# Patient Record
Sex: Female | Born: 1965
Health system: Southern US, Community
[De-identification: ages and names within clinical notes are randomized; demographics above are authoritative.]

## PROBLEM LIST (undated history)

## (undated) ENCOUNTER — Emergency Department (HOSPITAL_BASED_OUTPATIENT_CLINIC_OR_DEPARTMENT_OTHER): Admission: EM | Payer: No Typology Code available for payment source | Source: Home / Self Care

## (undated) DIAGNOSIS — G43909 Migraine, unspecified, not intractable, without status migrainosus: Secondary | ICD-10-CM

## (undated) DIAGNOSIS — R0981 Nasal congestion: Secondary | ICD-10-CM

## (undated) DIAGNOSIS — I10 Essential (primary) hypertension: Secondary | ICD-10-CM

## (undated) DIAGNOSIS — F418 Other specified anxiety disorders: Secondary | ICD-10-CM

## (undated) DIAGNOSIS — K219 Gastro-esophageal reflux disease without esophagitis: Secondary | ICD-10-CM

## (undated) HISTORY — PX: ABDOMINAL HYSTERECTOMY: SHX81

## (undated) HISTORY — DX: Gastro-esophageal reflux disease without esophagitis: K21.9

---

## 2011-11-08 ENCOUNTER — Emergency Department (HOSPITAL_COMMUNITY)
Admission: EM | Admit: 2011-11-08 | Discharge: 2011-11-08 | Disposition: A | Discharge status: Home / Self Care | Payer: Self-pay | Attending: Emergency Medicine | Admitting: Emergency Medicine

## 2011-11-08 ENCOUNTER — Encounter (HOSPITAL_COMMUNITY): Payer: Self-pay | Admitting: Physical Medicine and Rehabilitation

## 2011-11-08 ENCOUNTER — Emergency Department (HOSPITAL_COMMUNITY): Payer: Self-pay

## 2011-11-08 DIAGNOSIS — IMO0002 Reserved for concepts with insufficient information to code with codable children: Secondary | ICD-10-CM | POA: Insufficient documentation

## 2011-11-08 DIAGNOSIS — Y9229 Other specified public building as the place of occurrence of the external cause: Secondary | ICD-10-CM | POA: Insufficient documentation

## 2011-11-08 DIAGNOSIS — S43409A Unspecified sprain of unspecified shoulder joint, initial encounter: Secondary | ICD-10-CM

## 2011-11-08 DIAGNOSIS — F17200 Nicotine dependence, unspecified, uncomplicated: Secondary | ICD-10-CM | POA: Insufficient documentation

## 2011-11-08 DIAGNOSIS — S63509A Unspecified sprain of unspecified wrist, initial encounter: Secondary | ICD-10-CM | POA: Insufficient documentation
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
## 2011-11-08 NOTE — Progress Notes (Signed)
Orthopedic Tech Progress Note Patient Details:  Elaine Baker 1966-04-12 [id number]  Ortho Devices Type of Ortho Device: Velcro wrist splint;Arm foam sling Ortho Device/Splint Location: left wrist\arm Ortho Device/Splint Interventions: Application   Perla Echavarria 11/08/2011, 3:04 PM
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
## 2011-11-08 NOTE — Discharge Instructions (Signed)
Shoulder Sprain       A shoulder sprain is the result of damage to the tough, fiber-like tissues (ligaments) that help hold your shoulder in place. The ligaments may be stretched or torn. Besides the main shoulder joint (the ball and socket), there are several smaller joints that connect the bones in this area. A sprain usually involves one of those joints. Most often it is the acromioclavicular (or AC) joint. That is the joint that connects the collarbone (clavicle) and the shoulder blade (scapula) at the top point of the shoulder blade (acromion).   A shoulder sprain is a mild form of what is called a shoulder separation. Recovering from a shoulder sprain may take some time. For some, pain lingers for several months. Most people recover without long term problems.   CAUSES   A shoulder sprain is usually caused by some kind of trauma. This might be:   Falling on an outstretched arm.   Being hit hard on the shoulder.   Twisting the arm.   Shoulder sprains are more likely to occur in people who:   Play sports.   Have balance or coordination problems.  SYMPTOMS   Pain when you move your shoulder.   Limited ability to move the shoulder.   Swelling and tenderness on top of the shoulder.   Redness or warmth in the shoulder.   Bruising.   A change in the shape of the shoulder.  DIAGNOSIS   Your healthcare provider may:   Ask about your symptoms.   Ask about recent activity that might have caused those symptoms.   Examine your shoulder. You may be asked to do simple exercises to test movement. The other shoulder will be examined for comparison.   Order some tests that provide a look inside the body. They can show the extent of the injury. The tests could include:   X-rays.   CT (computed tomography) scan.   MRI (magnetic resonance imaging) scan.  RISKS AND COMPLICATIONS   Loss of full shoulder motion.   Ongoing shoulder pain.  TREATMENT   How long it takes to recover from a shoulder sprain depends on how severe it was.  Treatment options may include:   Rest. You should not use the arm or shoulder until it heals.   Ice. For 2 or 3 days after the injury, put an ice pack on the shoulder up to 4 times a day. It should stay on for 15 to 20 minutes each time. Wrap the ice in a towel so it does not touch your skin.   Over-the-counter medicine to relieve pain.   A sling or brace. This will keep the arm still while the shoulder is healing.   Physical therapy or rehabilitation exercises. These will help you regain strength and motion. Ask your healthcare provider when it is OK to begin these exercises.   Surgery. The need for surgery is rare with a sprained shoulder, but some people may need surgery to keep the joint in place and reduce pain.  HOME CARE INSTRUCTIONS   Ask your healthcare provider about what you should and should not do while your shoulder heals.   Make sure you know how to apply ice to the correct area of your shoulder.   Talk with your healthcare provider about which medications should be used for pain and swelling.   If rehabilitation therapy will be needed, ask your healthcare provider to refer you to a therapist. If it is not recommended, then ask   Your pain, swelling, or redness at the joint increases. SEEK IMMEDIATE MEDICAL CARE IF:   You have a fever.   You cannot move your arm or shoulder.  Document Released: 09/29/2008 Document Revised: 05/02/2011 Document Reviewed: 09/29/2008 [facility] Patient Information 2012 [place], [place].Wrist Pain A wrist sprain happens when the bands of tissue that hold the wrist joints together (ligament) stretch too much or tear. A wrist strain happens when muscles or bands of tissue that connect muscles to bones (tendons) are stretched or pulled. HOME CARE  Put ice on the injured area.   Put ice in a plastic bag.   Place a towel  between your skin and the bag.   Leave the ice on for 15 to 20 minutes, 3 to 4 times a day, for the first 2 days.   Raise (elevate) the injured wrist to lessen puffiness (swelling).   Rest the injured wrist for at least 48 hours or as told by your doctor.   Wear a splint, cast, or an elastic wrap as told by your doctor.   Only take medicine as told by your doctor.   Follow up with your doctor as told. This is important.  GET HELP RIGHT AWAY IF:   The fingers are puffy, very red, white, or cold and blue.   The fingers lose feeling (numb) or tingle.   The pain gets worse.   It is hard to move the fingers.  MAKE SURE YOU:   Understand these instructions.   Will watch your condition.   Will get help right away if you are not doing well or get worse.  Document Released: 10/30/2007 Document Revised: 05/02/2011 Document Reviewed: 07/04/2010 [facility] Patient Information 2012 [place], [place].
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
## 2012-03-01 ENCOUNTER — Emergency Department (HOSPITAL_BASED_OUTPATIENT_CLINIC_OR_DEPARTMENT_OTHER)
Admission: EM | Admit: 2012-03-01 | Discharge: 2012-03-01 | Disposition: A | Discharge status: Home / Self Care | Payer: Self-pay | Attending: Emergency Medicine | Admitting: Emergency Medicine

## 2012-03-01 ENCOUNTER — Encounter (HOSPITAL_BASED_OUTPATIENT_CLINIC_OR_DEPARTMENT_OTHER): Payer: Self-pay | Admitting: *Deleted

## 2012-03-01 DIAGNOSIS — W010XXA Fall on same level from slipping, tripping and stumbling without subsequent striking against object, initial encounter: Secondary | ICD-10-CM | POA: Insufficient documentation

## 2012-03-01 DIAGNOSIS — S40029A Contusion of unspecified upper arm, initial encounter: Secondary | ICD-10-CM | POA: Insufficient documentation

## 2012-03-01 DIAGNOSIS — S40022A Contusion of left upper arm, initial encounter: Secondary | ICD-10-CM

## 2012-03-01 DIAGNOSIS — F17200 Nicotine dependence, unspecified, uncomplicated: Secondary | ICD-10-CM | POA: Insufficient documentation

## 2012-03-01 MED ORDER — IBUPROFEN 400 MG PO TABS
600.0000 mg | ORAL_TABLET | Freq: Once | ORAL | Status: AC
  Administered 2012-03-01: 600 mg via ORAL
  Filled 2012-03-01 (×2): qty 1

## 2012-03-01 MED ORDER — IBUPROFEN 600 MG PO TABS: 600.0000 mg | ORAL_TABLET | Freq: Four times a day (QID) | ORAL | Status: DC | PRN

## 2012-03-01 NOTE — ED Provider Notes (Signed)
History     CSN: [id number]  Arrival date & time 03/01/12  1152   First MD Initiated Contact with Patient 03/01/12 1243      Chief Complaint  Patient presents with  . Arm Injury    (Consider location/radiation/quality/duration/timing/severity/associated sxs/prior treatment) HPI Pt tripped and fell onto L arm last night. No definite injury, deformity, swelling. No numbness or weakness. Pt c/o of diffuse pain to the arm without focality.  History reviewed. No pertinent past medical history.  Past Surgical History  Procedure Date  . Abdominal hysterectomy     History reviewed. No pertinent family history.  History  Substance Use Topics  . Smoking status: Current Every Day Smoker  . Smokeless tobacco: Not on file  . Alcohol Use: No    OB History    Grav Para Term Preterm Abortions TAB SAB Ect Mult Living                  Review of Systems  Constitutional: Negative for fever and chills.  HENT: Negative for neck pain.   Respiratory: Negative for shortness of breath.   Gastrointestinal: Negative for nausea and vomiting.  Musculoskeletal: Positive for myalgias and arthralgias. Negative for back pain and joint swelling.  Skin: Negative for rash and wound.  Neurological: Negative for dizziness, weakness, light-headedness, numbness and headaches.    Allergies  Review of patient's allergies indicates no known allergies.  Home Medications   Current Outpatient Rx  Name Route Sig Dispense Refill  . ALKA-SELTZER PO Oral Take 2 tablets by mouth 3 (three) times daily as needed. For body aches.    . IBUPROFEN 600 MG PO TABS Oral Take 1 tablet (600 mg total) by mouth every 6 (six) hours as needed for pain. 30 tablet 0    BP 124/74  Pulse 68  Temp 97.8 F (36.6 C)  Resp 20  SpO2 98%  Physical Exam  Nursing note and vitals reviewed. Constitutional: She is oriented to person, place, and time. She appears well-developed and well-nourished. No distress.  HENT:  Head:  Normocephalic and atraumatic.  Mouth/Throat: Oropharynx is clear and moist.  Eyes: EOM are normal. Pupils are equal, round, and reactive to light.  Neck: Normal range of motion. Neck supple.  Cardiovascular: Normal rate and regular rhythm.   Pulmonary/Chest: Effort normal and breath sounds normal. No respiratory distress. She has no wheezes. She has no rales.  Abdominal: Soft. Bowel sounds are normal.  Musculoskeletal: Normal range of motion. She exhibits tenderness (general TTP of musculature of L arm. FROM at all joints. no swelling bruising or deformity. no snuff box tenderness. 2+ radial pulse. ). She exhibits no edema.  Neurological: She is alert and oriented to person, place, and time.       5/5 motor, sensation is intact  Skin: Skin is warm and dry. No rash noted. No erythema.  Psychiatric: She has a normal mood and affect. Her behavior is normal.    ED Course  Procedures (including critical care time)  Labs Reviewed - No data to display No results found.   1. Contusion of left arm       MDM          Loren Racer, MD 03/01/12 1303

## 2012-03-01 NOTE — ED Notes (Signed)
Patient was leaning on a post that broke and she fell on L arm, hurts to move arm, took some tylenol and naproxen but no relief. States it hurts from my hand up to my shoulder

## 2012-03-06 ENCOUNTER — Emergency Department (HOSPITAL_BASED_OUTPATIENT_CLINIC_OR_DEPARTMENT_OTHER): Payer: Self-pay

## 2012-03-06 ENCOUNTER — Emergency Department (HOSPITAL_BASED_OUTPATIENT_CLINIC_OR_DEPARTMENT_OTHER)
Admission: EM | Admit: 2012-03-06 | Discharge: 2012-03-06 | Disposition: A | Discharge status: Home / Self Care | Payer: Self-pay | Attending: Emergency Medicine | Admitting: Emergency Medicine

## 2012-03-06 ENCOUNTER — Encounter (HOSPITAL_BASED_OUTPATIENT_CLINIC_OR_DEPARTMENT_OTHER): Payer: Self-pay | Admitting: *Deleted

## 2012-03-06 DIAGNOSIS — W19XXXA Unspecified fall, initial encounter: Secondary | ICD-10-CM | POA: Insufficient documentation

## 2012-03-06 DIAGNOSIS — F17200 Nicotine dependence, unspecified, uncomplicated: Secondary | ICD-10-CM | POA: Insufficient documentation

## 2012-03-06 DIAGNOSIS — S53409A Unspecified sprain of unspecified elbow, initial encounter: Secondary | ICD-10-CM

## 2012-03-06 DIAGNOSIS — IMO0002 Reserved for concepts with insufficient information to code with codable children: Secondary | ICD-10-CM | POA: Insufficient documentation

## 2012-03-06 MED ORDER — HYDROCODONE-ACETAMINOPHEN 5-325 MG PO TABS
2.0000 | ORAL_TABLET | Freq: Once | ORAL | Status: AC
  Administered 2012-03-06: 2 via ORAL
  Filled 2012-03-06: qty 2

## 2012-03-06 MED ORDER — OXYCODONE-ACETAMINOPHEN 5-325 MG PO TABS: 2.0000 | ORAL_TABLET | ORAL | Status: DC | PRN

## 2012-03-06 MED ORDER — IBUPROFEN 800 MG PO TABS: 800.0000 mg | ORAL_TABLET | Freq: Three times a day (TID) | ORAL | Status: DC

## 2012-03-06 NOTE — ED Provider Notes (Signed)
History     CSN: [id number]  Arrival date & time 03/06/12  1701   First MD Initiated Contact with Patient 03/06/12 1830      Chief Complaint  Patient presents with  . Arm Pain    (Consider location/radiation/quality/duration/timing/severity/associated sxs/prior treatment) HPI Comments: Patient presents with left elbow pain that started one week ago after falling and landing on her left elbow. The pain started immediately and is described as throbbing and severe. The pain radiates up to her left shoulder. Movement of the elbow makes the pain worse. No alleviating factors. Patient seen one week ago, after she fell and was prescribed 600mg  ibuprofen and did not have xray done. Patient reports persistent pain despite ibuprofen and is concerned something is broken. Patient reports associated left arm swelling and finger tingling. Patient denies head injury, new injury in the past week, numbness of her left arm, coolness/weakness of her left arm.    History reviewed. No pertinent past medical history.  Past Surgical History  Procedure Date  . Abdominal hysterectomy     No family history on file.  History  Substance Use Topics  . Smoking status: Current Every Day Smoker  . Smokeless tobacco: Not on file  . Alcohol Use: No    OB History    Grav Para Term Preterm Abortions TAB SAB Ect Mult Living                  Review of Systems  Musculoskeletal: Positive for arthralgias.  All other systems reviewed and are negative.    Allergies  Review of patient's allergies indicates no known allergies.  Home Medications   Current Outpatient Rx  Name Route Sig Dispense Refill  . ALKA-SELTZER PO Oral Take 2 tablets by mouth 3 (three) times daily as needed. For body aches.    . IBUPROFEN 600 MG PO TABS Oral Take 1 tablet (600 mg total) by mouth every 6 (six) hours as needed for pain. 30 tablet 0    BP 177/98  Pulse 67  Temp 98.1 F (36.7 C) (Oral)  Resp 20  SpO2  98%  Physical Exam  Nursing note and vitals reviewed. Constitutional: She appears well-developed and well-nourished. No distress.  HENT:  Head: Normocephalic and atraumatic.  Eyes: Conjunctivae normal are normal. No scleral icterus.  Neck: Normal range of motion. Neck supple.  Cardiovascular: Normal rate and regular rhythm.  Exam reveals no gallop and no friction rub.   No murmur heard.      Sufficient capillary refill of distal upper extremities. Pulses intact.   Pulmonary/Chest: Effort normal and breath sounds normal. She has no wheezes. She has no rales. She exhibits no tenderness.  Abdominal: Soft. She exhibits no distension.  Musculoskeletal: Normal range of motion.  Neurological: She is alert.       Upper extremity strength and sensation equal and intact bilaterally. Speech is goal-oriented. Moves limbs without ataxia.   Skin: Skin is warm and dry.  Psychiatric: She has a normal mood and affect. Her behavior is normal.    ED Course  Procedures (including critical care time)  Labs Reviewed - No data to display Dg Elbow Complete Left  03/06/2012  *RADIOLOGY REPORT*  Clinical Data: Fall 1 week ago.  Left elbow injury and pain.  LEFT ELBOW - COMPLETE 3+ VIEW  Comparison:  None.  Findings:  There is no evidence of fracture, dislocation, or joint effusion. Mild degenerative spurring is seen involving the medial and lateral humeral condyles.  No other  significant bone abnormality identified.  Soft tissues are unremarkable.  IMPRESSION: 1.  No acute findings. 2.  Mild degenerative spurring.   Original Report Authenticated By: Danae Orleans, M.D.      1. Elbow sprain       MDM  7:38 PM Patient's elbow xray negative for fracture. Residual swelling and pain. I will discharge her with pain medication and instructions for RICE therapy. No further evaluation needed at this time.        Emilia Beck, PA-C 03/08/12 0028

## 2012-03-06 NOTE — ED Notes (Signed)
Pain in her right arm after a fall a week ago. Was seen a week ago with same.

## 2012-03-08 NOTE — ED Provider Notes (Signed)
Medical screening examination/treatment/procedure(s) were performed by non-physician practitioner and as supervising physician I was immediately available for consultation/collaboration.   Rolan Bucco, MD 03/08/12 1324

## 2012-04-30 ENCOUNTER — Emergency Department (HOSPITAL_BASED_OUTPATIENT_CLINIC_OR_DEPARTMENT_OTHER)
Admission: EM | Admit: 2012-04-30 | Discharge: 2012-04-30 | Disposition: A | Discharge status: Home / Self Care | Payer: Self-pay | Attending: Emergency Medicine | Admitting: Emergency Medicine

## 2012-04-30 ENCOUNTER — Emergency Department (HOSPITAL_BASED_OUTPATIENT_CLINIC_OR_DEPARTMENT_OTHER): Payer: Self-pay

## 2012-04-30 ENCOUNTER — Encounter (HOSPITAL_BASED_OUTPATIENT_CLINIC_OR_DEPARTMENT_OTHER): Payer: Self-pay | Admitting: *Deleted

## 2012-04-30 DIAGNOSIS — F17200 Nicotine dependence, unspecified, uncomplicated: Secondary | ICD-10-CM | POA: Insufficient documentation

## 2012-04-30 DIAGNOSIS — K219 Gastro-esophageal reflux disease without esophagitis: Secondary | ICD-10-CM | POA: Insufficient documentation

## 2012-04-30 DIAGNOSIS — S8990XA Unspecified injury of unspecified lower leg, initial encounter: Secondary | ICD-10-CM | POA: Insufficient documentation

## 2012-04-30 DIAGNOSIS — Z79899 Other long term (current) drug therapy: Secondary | ICD-10-CM | POA: Insufficient documentation

## 2012-04-30 DIAGNOSIS — M25579 Pain in unspecified ankle and joints of unspecified foot: Secondary | ICD-10-CM

## 2012-04-30 DIAGNOSIS — B351 Tinea unguium: Secondary | ICD-10-CM | POA: Insufficient documentation

## 2012-04-30 DIAGNOSIS — M25569 Pain in unspecified knee: Secondary | ICD-10-CM

## 2012-04-30 DIAGNOSIS — W2203XA Walked into furniture, initial encounter: Secondary | ICD-10-CM | POA: Insufficient documentation

## 2012-04-30 DIAGNOSIS — Y939 Activity, unspecified: Secondary | ICD-10-CM | POA: Insufficient documentation

## 2012-04-30 DIAGNOSIS — S99929A Unspecified injury of unspecified foot, initial encounter: Secondary | ICD-10-CM | POA: Insufficient documentation

## 2012-04-30 DIAGNOSIS — Y92009 Unspecified place in unspecified non-institutional (private) residence as the place of occurrence of the external cause: Secondary | ICD-10-CM | POA: Insufficient documentation

## 2012-04-30 MED ORDER — OMEPRAZOLE 20 MG PO CPDR: 20.0000 mg | DELAYED_RELEASE_CAPSULE | Freq: Every evening | ORAL | Status: DC | PRN

## 2012-04-30 MED ORDER — IBUPROFEN 400 MG PO TABS: 400.0000 mg | ORAL_TABLET | Freq: Four times a day (QID) | ORAL | Status: DC | PRN

## 2012-04-30 NOTE — ED Notes (Signed)
Patient states she hit her left knee on the bed yesterday and now has pain deep into her left knee.  States she also has toenail fungus and wants to be treated for that as well.

## 2012-04-30 NOTE — ED Notes (Addendum)
D/c home with family- directed to pharmacy to pick up RX x 2- ambulatory without need for assist- ice pack given for home use

## 2012-05-03 NOTE — ED Provider Notes (Signed)
History     CSN: [id number]  Arrival date & time 04/30/12  1023   First MD Initiated Contact with Patient 04/30/12 1136      Chief Complaint  Patient presents with  . Knee Pain  . Ankle Pain    left    (Consider location/radiation/quality/duration/timing/severity/associated sxs/prior treatment) HPIRegina Baker is a 46 y.o. female presenting with her family to the ER because she had her left knee on the bed yesterday and has pain in her left knee. She's been walking on her knee since then. Patient's pain is rated as moderate worse with walking, does not radiate, is not associated with any fevers, chills, nausea vomiting or diarrhea, no chest pain or shortness of breath. Is been no bleeding. Patient also complains about toenail fungus to bilateral feet and requesting treatment.   Past Medical History  Diagnosis Date  . Reflux     Past Surgical History  Procedure Date  . Abdominal hysterectomy     No family history on file.  History  Substance Use Topics  . Smoking status: Current Every Day Smoker -- 0.5 packs/day    Types: Cigarettes  . Smokeless tobacco: Not on file  . Alcohol Use: No    OB History    Grav Para Term Preterm Abortions TAB SAB Ect Mult Living                  Review of Systems At least 10pt or greater review of systems completed and are negative except where specified in the HPI.  Allergies  Vicodin  Home Medications   Current Outpatient Rx  Name  Route  Sig  Dispense  Refill  . ALKA-SELTZER PO   Oral   Take 2 tablets by mouth 3 (three) times daily as needed. For body aches.         . IBUPROFEN 400 MG PO TABS   Oral   Take 1 tablet (400 mg total) by mouth every 6 (six) hours as needed for pain.   30 tablet   0   . IBUPROFEN 600 MG PO TABS   Oral   Take 1 tablet (600 mg total) by mouth every 6 (six) hours as needed for pain.   30 tablet   0   . IBUPROFEN 800 MG PO TABS   Oral   Take 1 tablet (800 mg total) by mouth 3  (three) times daily.   21 tablet   0   . OMEPRAZOLE 20 MG PO CPDR   Oral   Take 1 capsule (20 mg total) by mouth at bedtime and may repeat dose one time if needed.   30 capsule   1   . OXYCODONE-ACETAMINOPHEN 5-325 MG PO TABS   Oral   Take 2 tablets by mouth every 4 (four) hours as needed for pain.   6 tablet   0     BP 124/80  Pulse 99  Temp 98.2 F (36.8 C) (Oral)  Resp 16  Ht 5\' 4"  (1.626 m)  Wt 154 lb (69.854 kg)  BMI 26.43 kg/m2  SpO2 100%  Physical Exam  Nursing notes reviewed.  Electronic medical record reviewed. VITAL SIGNS:   Filed Vitals:   04/30/12 1049  BP: 124/80  Pulse: 99  Temp: 98.2 F (36.8 C)  TempSrc: Oral  Resp: 16  Height: 5\' 4"  (1.626 m)  Weight: 154 lb (69.854 kg)  SpO2: 100%   CONSTITUTIONAL: Awake, oriented, appears non-toxic HENT: Atraumatic, normocephalic, oral mucosa pink and moist, airway  patent. Nares patent without drainage. External ears normal. EYES: Conjunctiva clear, EOMI, PERRLA NECK: Trachea midline, non-tender, supple CARDIOVASCULAR: Normal heart rate, Normal rhythm, No murmurs, rubs, gallops PULMONARY/CHEST: Clear to auscultation, no rhonchi, wheezes, or rales. Symmetrical breath sounds. Non-tender. ABDOMINAL: Non-distended, soft, non-tender - no rebound or guarding.  BS normal. NEUROLOGIC: Non-focal, moving all four extremities, no gross sensory or motor deficits. EXTREMITIES: No clubbing, cyanosis, or edema. No real tenderness to palpation in the left knee. Bilateral lower extremity onychomycosis SKIN: Warm, Dry, No erythema, No rash  ED Course  Procedures (including critical care time)  Labs Reviewed - No data to display No results found.   1. Knee pain   2. Ankle pain   3. GERD (gastroesophageal reflux disease)       MDM  Elaine Baker is a 45 y.o. female presenting with some knee pain-I do not think anything is broken, patient meets criteria for no x-rays by [place] ankle and knee rules so x-rays not  indicated at this time. Conservative therapy including rest ice compression and elevation including NSAIDs.  I had a discussion with the patient concerning her bilateral lower extremity onychomycosis and that the treatment is largely cosmetic-the treatment for that does run a risk of liver damage and had that mentioned, the patient declines treatment.  I explained the diagnosis and have given explicit precautions to return to the ER including any other new or worsening symptoms. The patient understands and accepts the medical plan as it's been dictated and I have answered their questions. Discharge instructions concerning home care and prescriptions have been given.  The patient is STABLE and is discharged to home in good condition.          Jones Skene, MD 05/03/12 2144

## 2012-07-07 NOTE — ED Notes (Signed)
Pt called and stated that she had received the wrong medical records.  States she was seen at the same time as her daughter with the same name.  States she was seen for her knee and ankle pain from an injury.  Reviewed discharge paper work with pt which shows she was seen for knee and ankle pain, given prescriptions for ibuprofen and omeprasole which she states she only received ibuprofen.  Pt was yelling into the phone and sounded very angry about her paperwork and stated that she would not pay the bill and would not be coming back. Reassured pt that she was seen for what she was telling me she was here for and to contact patient accounting for concerns about her statement.

## 2012-08-13 ENCOUNTER — Emergency Department (HOSPITAL_COMMUNITY)
Admission: EM | Admit: 2012-08-13 | Discharge: 2012-08-13 | Disposition: A | Discharge status: Home / Self Care | Payer: No Typology Code available for payment source | Attending: Emergency Medicine | Admitting: Emergency Medicine

## 2012-08-13 ENCOUNTER — Encounter (HOSPITAL_COMMUNITY): Payer: Self-pay | Admitting: General Practice

## 2012-08-13 DIAGNOSIS — Y9389 Activity, other specified: Secondary | ICD-10-CM | POA: Insufficient documentation

## 2012-08-13 DIAGNOSIS — F17200 Nicotine dependence, unspecified, uncomplicated: Secondary | ICD-10-CM | POA: Insufficient documentation

## 2012-08-13 DIAGNOSIS — S134XXA Sprain of ligaments of cervical spine, initial encounter: Secondary | ICD-10-CM

## 2012-08-13 DIAGNOSIS — R209 Unspecified disturbances of skin sensation: Secondary | ICD-10-CM | POA: Insufficient documentation

## 2012-08-13 DIAGNOSIS — Z8719 Personal history of other diseases of the digestive system: Secondary | ICD-10-CM | POA: Insufficient documentation

## 2012-08-13 DIAGNOSIS — G44309 Post-traumatic headache, unspecified, not intractable: Secondary | ICD-10-CM | POA: Insufficient documentation

## 2012-08-13 DIAGNOSIS — Y92410 Unspecified street and highway as the place of occurrence of the external cause: Secondary | ICD-10-CM | POA: Insufficient documentation

## 2012-08-13 DIAGNOSIS — S139XXA Sprain of joints and ligaments of unspecified parts of neck, initial encounter: Secondary | ICD-10-CM | POA: Insufficient documentation

## 2012-08-13 MED ORDER — ACETAMINOPHEN-CODEINE #3 300-30 MG PO TABS: 1.0000 | ORAL_TABLET | Freq: Four times a day (QID) | ORAL | Status: DC | PRN

## 2012-08-13 MED ORDER — MELOXICAM 15 MG PO TABS: 15.0000 mg | ORAL_TABLET | Freq: Every day | ORAL | Status: DC

## 2012-08-13 MED ORDER — CYCLOBENZAPRINE HCL 5 MG PO TABS: 5.0000 mg | ORAL_TABLET | Freq: Three times a day (TID) | ORAL | Status: DC | PRN

## 2012-08-13 NOTE — ED Notes (Signed)
Pt seen turning head to watch television (on right side)
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
## 2012-08-13 NOTE — ED Provider Notes (Signed)
Medical screening examination/treatment/procedure(s) were performed by non-physician practitioner and as supervising physician I was immediately available for consultation/collaboration.  Doug Sou, MD 08/13/12 [phone]

## 2012-10-20 ENCOUNTER — Encounter (HOSPITAL_BASED_OUTPATIENT_CLINIC_OR_DEPARTMENT_OTHER): Payer: Self-pay | Admitting: *Deleted

## 2012-10-20 ENCOUNTER — Emergency Department (HOSPITAL_BASED_OUTPATIENT_CLINIC_OR_DEPARTMENT_OTHER)
Admission: EM | Admit: 2012-10-20 | Discharge: 2012-10-20 | Disposition: A | Discharge status: Home / Self Care | Payer: Self-pay | Attending: Emergency Medicine | Admitting: Emergency Medicine

## 2012-10-20 DIAGNOSIS — K0889 Other specified disorders of teeth and supporting structures: Secondary | ICD-10-CM

## 2012-10-20 DIAGNOSIS — Z8719 Personal history of other diseases of the digestive system: Secondary | ICD-10-CM | POA: Insufficient documentation

## 2012-10-20 DIAGNOSIS — F17200 Nicotine dependence, unspecified, uncomplicated: Secondary | ICD-10-CM | POA: Insufficient documentation

## 2012-10-20 DIAGNOSIS — K029 Dental caries, unspecified: Secondary | ICD-10-CM | POA: Insufficient documentation

## 2012-10-20 DIAGNOSIS — K089 Disorder of teeth and supporting structures, unspecified: Secondary | ICD-10-CM | POA: Insufficient documentation

## 2012-10-20 DIAGNOSIS — Z8659 Personal history of other mental and behavioral disorders: Secondary | ICD-10-CM | POA: Insufficient documentation

## 2012-10-20 HISTORY — DX: Other specified anxiety disorders: F41.8

## 2012-10-20 MED ORDER — ACETAMINOPHEN-CODEINE 300-60 MG PO TABS: 1.0000 | ORAL_TABLET | ORAL | Status: DC | PRN

## 2012-10-20 MED ORDER — PENICILLIN V POTASSIUM 250 MG PO TABS: 250.0000 mg | ORAL_TABLET | Freq: Four times a day (QID) | ORAL | Status: AC

## 2012-10-20 NOTE — ED Notes (Signed)
Patient states she has had nausea for the last four days, associated with her "bad broken"  Teeth and a headache.

## 2012-10-20 NOTE — ED Provider Notes (Signed)
  Medical screening examination/treatment/procedure(s) were performed by non-physician practitioner and as supervising physician I was immediately available for consultation/collaboration.    Vida Roller, MD 10/20/12 2030

## 2012-10-20 NOTE — ED Notes (Signed)
Smells strongly of etoh

## 2012-10-20 NOTE — ED Provider Notes (Signed)
History     CSN: [id number]  Arrival date & time 10/20/12  1702   First MD Initiated Contact with Patient 10/20/12 1715      Chief Complaint  Patient presents with  . Nausea    (Consider location/radiation/quality/duration/timing/severity/associated sxs/prior treatment) HPI Comments: Pt states that she has had dental pain for the last 4 days:pt states that she has also had a headache and some nausea:pt states that she has not been able to see the dentist because she doesn't have insurance  The history is provided by the patient. No language interpreter was used.    Past Medical History  Diagnosis Date  . Reflux   . Depression with anxiety     Past Surgical History  Procedure Laterality Date  . Abdominal hysterectomy      No family history on file.  History  Substance Use Topics  . Smoking status: Current Every Day Smoker -- 0.50 packs/day    Types: Cigarettes  . Smokeless tobacco: Not on file  . Alcohol Use: No    OB History   Grav Para Term Preterm Abortions TAB SAB Ect Mult Living                  Review of Systems  Constitutional: Negative.  Negative for fever.  Respiratory: Negative.   Cardiovascular: Negative.     Allergies  Vicodin  Home Medications   Current Outpatient Rx  Name  Route  Sig  Dispense  Refill  . acetaminophen-codeine (TYLENOL #3) 300-30 MG per tablet   Oral   Take 1-2 tablets by mouth every 6 (six) hours as needed for pain.   15 tablet   0   . cyclobenzaprine (FLEXERIL) 5 MG tablet   Oral   Take 1 tablet (5 mg total) by mouth 3 (three) times daily as needed for muscle spasms.   30 tablet   0   . diphenhydramine-acetaminophen (TYLENOL PM) 25-500 MG TABS   Oral   Take 1-2 tablets by mouth at bedtime as needed (for sleep).         . meloxicam (MOBIC) 15 MG tablet   Oral   Take 1 tablet (15 mg total) by mouth daily. Take 1 daily with food.   10 tablet   0     BP 147/88  Pulse 58  Temp(Src) 98.7 F (37.1 C)  (Oral)  Resp 15  Ht 5\' 4"  (1.626 m)  Wt 155 lb (70.308 kg)  BMI 26.59 kg/m2  SpO2 98%  Physical Exam  Nursing note and vitals reviewed. Constitutional: She is oriented to person, place, and time. She appears well-developed and well-nourished.  HENT:  Head: Normocephalic and atraumatic.  Pt has multiple decayed and broken teeth  Eyes: Conjunctivae are normal.  Neck: Neck supple.  Cardiovascular: Normal rate and regular rhythm.   Pulmonary/Chest: Effort normal and breath sounds normal.  Abdominal: Soft. Bowel sounds are normal. There is no tenderness.  Musculoskeletal: Normal range of motion.  Neurological: She is alert and oriented to person, place, and time.  Skin: Skin is warm and dry.    ED Course  Procedures (including critical care time)  Labs Reviewed - No data to display No results found.   1. Toothache       MDM  Will treat with pcn and tylenol with codeine for toothache:pt also given dental referal        Teressa Lower, NP 10/20/12 1758

## 2013-04-10 ENCOUNTER — Encounter (HOSPITAL_BASED_OUTPATIENT_CLINIC_OR_DEPARTMENT_OTHER): Payer: Self-pay | Admitting: Emergency Medicine

## 2013-04-10 ENCOUNTER — Emergency Department (HOSPITAL_BASED_OUTPATIENT_CLINIC_OR_DEPARTMENT_OTHER)
Admission: EM | Admit: 2013-04-10 | Discharge: 2013-04-10 | Disposition: A | Discharge status: Home / Self Care | Payer: Self-pay | Attending: Emergency Medicine | Admitting: Emergency Medicine

## 2013-04-10 DIAGNOSIS — Z8659 Personal history of other mental and behavioral disorders: Secondary | ICD-10-CM | POA: Insufficient documentation

## 2013-04-10 DIAGNOSIS — Z8719 Personal history of other diseases of the digestive system: Secondary | ICD-10-CM | POA: Insufficient documentation

## 2013-04-10 DIAGNOSIS — K0889 Other specified disorders of teeth and supporting structures: Secondary | ICD-10-CM

## 2013-04-10 DIAGNOSIS — K089 Disorder of teeth and supporting structures, unspecified: Secondary | ICD-10-CM | POA: Insufficient documentation

## 2013-04-10 DIAGNOSIS — F17200 Nicotine dependence, unspecified, uncomplicated: Secondary | ICD-10-CM | POA: Insufficient documentation

## 2013-04-10 MED ORDER — OXYCODONE-ACETAMINOPHEN 5-325 MG PO TABS: 1.0000 | ORAL_TABLET | ORAL | Status: DC | PRN

## 2013-04-10 MED ORDER — IBUPROFEN 800 MG PO TABS: 800.0000 mg | ORAL_TABLET | Freq: Three times a day (TID) | ORAL | Status: DC

## 2013-04-10 MED ORDER — PENICILLIN V POTASSIUM 500 MG PO TABS: 500.0000 mg | ORAL_TABLET | Freq: Three times a day (TID) | ORAL | Status: DC

## 2013-04-10 NOTE — ED Provider Notes (Signed)
Medical screening examination/treatment/procedure(s) were performed by non-physician practitioner and as supervising physician I was immediately available for consultation/collaboration.  EKG Interpretation   None         Megan E Docherty, MD 04/10/13 2036 

## 2013-04-10 NOTE — ED Provider Notes (Signed)
CSN: [id number]     Arrival date & time 04/10/13  1222 History   First MD Initiated Contact with Patient 04/10/13 1304     Chief Complaint  Patient presents with  . Dental Pain   (Consider location/radiation/quality/duration/timing/severity/associated sxs/prior Treatment) Patient is a 47 y.o. female presenting with tooth pain. The history is provided by the patient. No language interpreter was used.  Dental Pain Location:  Upper Upper teeth location:  2/RU 2nd molar Associated symptoms: no facial swelling and no fever   Associated symptoms comment:  She reports breaking the tooth in the past with increasing pain over the last week. No fever, vomiting. No significant facial swelling.   Past Medical History  Diagnosis Date  . Reflux   . Depression with anxiety    Past Surgical History  Procedure Laterality Date  . Abdominal hysterectomy     No family history on file. History  Substance Use Topics  . Smoking status: Current Every Day Smoker -- 0.50 packs/day    Types: Cigarettes  . Smokeless tobacco: Not on file  . Alcohol Use: No   OB History   Grav Para Term Preterm Abortions TAB SAB Ect Mult Living                 Review of Systems  Constitutional: Negative for fever and chills.  HENT: Positive for dental problem. Negative for facial swelling and trouble swallowing.   Gastrointestinal: Negative.  Negative for vomiting.    Allergies  Vicodin  Home Medications  No current outpatient prescriptions on file. BP 143/91  Pulse 67  Temp(Src) 97.6 F (36.4 C) (Oral)  Resp 18  SpO2 96% Physical Exam  Constitutional: She is oriented to person, place, and time. She appears well-developed and well-nourished.  HENT:  Mouth/Throat: Oropharynx is clear and moist.  Upper right rear molar partially absent. No significant swelling or pointing abscess of alveolar ridge. TEnder to palpation of tooth and surround area.   Neck: Normal range of motion.  Pulmonary/Chest: Effort  normal.  Neurological: She is alert and oriented to person, place, and time.  Skin: Skin is warm and dry.    ED Course  Procedures (including critical care time) Labs Review Labs Reviewed - No data to display Imaging Review No results found.  EKG Interpretation   None       MDM  No diagnosis found. 1. Dental pain  Uncomplicated dental pain. Will cover with abx, pain medication. Encourage dental follow up.    Arnoldo Hooker, PA-C 04/10/13 1336

## 2013-04-10 NOTE — ED Notes (Signed)
Patient here with right upper toothache x 3 days, tooth broken on assessment. Taking otc meds without relief

## 2013-08-03 ENCOUNTER — Encounter (HOSPITAL_BASED_OUTPATIENT_CLINIC_OR_DEPARTMENT_OTHER): Payer: Self-pay | Admitting: Emergency Medicine

## 2013-08-03 ENCOUNTER — Emergency Department (HOSPITAL_BASED_OUTPATIENT_CLINIC_OR_DEPARTMENT_OTHER)
Admission: EM | Admit: 2013-08-03 | Discharge: 2013-08-03 | Disposition: A | Discharge status: Home / Self Care | Payer: 59 | Attending: Emergency Medicine | Admitting: Emergency Medicine

## 2013-08-03 DIAGNOSIS — Z8679 Personal history of other diseases of the circulatory system: Secondary | ICD-10-CM | POA: Insufficient documentation

## 2013-08-03 DIAGNOSIS — Z79899 Other long term (current) drug therapy: Secondary | ICD-10-CM | POA: Insufficient documentation

## 2013-08-03 DIAGNOSIS — M25519 Pain in unspecified shoulder: Secondary | ICD-10-CM | POA: Insufficient documentation

## 2013-08-03 DIAGNOSIS — F17200 Nicotine dependence, unspecified, uncomplicated: Secondary | ICD-10-CM | POA: Insufficient documentation

## 2013-08-03 DIAGNOSIS — Z791 Long term (current) use of non-steroidal anti-inflammatories (NSAID): Secondary | ICD-10-CM | POA: Insufficient documentation

## 2013-08-03 DIAGNOSIS — F341 Dysthymic disorder: Secondary | ICD-10-CM | POA: Insufficient documentation

## 2013-08-03 HISTORY — DX: Migraine, unspecified, not intractable, without status migrainosus: G43.909

## 2013-08-03 MED ORDER — MELOXICAM 7.5 MG PO TABS: 7.5000 mg | ORAL_TABLET | Freq: Every day | ORAL | Status: DC

## 2013-08-03 NOTE — ED Provider Notes (Signed)
CSN: [id number]     Arrival date & time 08/03/13  1639 History   First MD Initiated Contact with Patient 08/03/13 1844     Chief Complaint  Patient presents with  . Arm Pain     (Consider location/radiation/quality/duration/timing/severity/associated sxs/prior Treatment) Patient is a 48 y.o. female presenting with arm pain. The history is provided by the patient. No language interpreter was used.  Arm Pain This is a recurrent problem. The current episode started in the past 7 days. The problem occurs intermittently. The problem has been waxing and waning. Associated symptoms include joint swelling and myalgias. Nothing aggravates the symptoms. She has tried nothing for the symptoms. The treatment provided mild relief.    Past Medical History  Diagnosis Date  . Reflux   . Depression with anxiety   . Migraine    Past Surgical History  Procedure Laterality Date  . Abdominal hysterectomy     No family history on file. History  Substance Use Topics  . Smoking status: Current Every Day Smoker -- 0.50 packs/day    Types: Cigarettes  . Smokeless tobacco: Not on file  . Alcohol Use: No   OB History   Grav Para Term Preterm Abortions TAB SAB Ect Mult Living                 Review of Systems  Musculoskeletal: Positive for joint swelling and myalgias.  All other systems reviewed and are negative.      Allergies  Vicodin  Home Medications   Current Outpatient Rx  Name  Route  Sig  Dispense  Refill  . ibuprofen (ADVIL,MOTRIN) 800 MG tablet   Oral   Take 1 tablet (800 mg total) by mouth 3 (three) times daily.   21 tablet   0   . meloxicam (MOBIC) 7.5 MG tablet   Oral   Take 1 tablet (7.5 mg total) by mouth daily.   30 tablet   0   . oxyCODONE-acetaminophen (PERCOCET/ROXICET) 5-325 MG per tablet   Oral   Take 1 tablet by mouth every 4 (four) hours as needed for severe pain.   8 tablet   0   . penicillin v potassium (VEETID) 500 MG tablet   Oral   Take 1 tablet  (500 mg total) by mouth 3 (three) times daily.   30 tablet   0    BP 131/81  Pulse 63  Temp(Src) 98.5 F (36.9 C) (Oral)  Resp 16  Ht 5\' 4"  (1.626 m)  Wt 149 lb (67.586 kg)  BMI 25.56 kg/m2  SpO2 97% Physical Exam  Constitutional: She is oriented to person, place, and time. She appears well-developed and well-nourished.  HENT:  Head: Normocephalic.  Cardiovascular: Normal rate.   Pulmonary/Chest: Effort normal.  Musculoskeletal: She exhibits tenderness.  No swelling no deformity  From  Shoulder, elbow and wrist  Neurological: She is alert and oriented to person, place, and time. She has normal reflexes.  Skin: Skin is warm.  Psychiatric: She has a normal mood and affect.    ED Course  Procedures (including critical care time) Labs Review Labs Reviewed - No data to display Imaging Review No results found.   EKG Interpretation None      MDM Pt also reports knee pain on and off.   No primary MD.   Pt given referral.  I will try meloxicam for symptoms   Final diagnoses:  Shoulder pain    Pt given rx for meloxicam.  Pt given number for  primary care for followup    Elson AreasLeslie K Vernetta Dizdarevic, PA-C 08/03/13 2023

## 2013-08-03 NOTE — ED Provider Notes (Signed)
Medical screening examination/treatment/procedure(s) were performed by non-physician practitioner and as supervising physician I was immediately available for consultation/collaboration.   EKG Interpretation None        Dagmar HaitWilliam Teoman Giraud, MD 08/03/13 2257

## 2013-08-03 NOTE — Discharge Instructions (Signed)
Arthralgia  Your caregiver has diagnosed you as suffering from an arthralgia. Arthralgia means there is pain in a joint. This can come from many reasons including:  · Bruising the joint which causes soreness (inflammation) in the joint.  · Wear and tear on the joints which occur as we grow older (osteoarthritis).  · Overusing the joint.  · Various forms of arthritis.  · Infections of the joint.  Regardless of the cause of pain in your joint, most of these different pains respond to anti-inflammatory drugs and rest. The exception to this is when a joint is infected, and these cases are treated with antibiotics, if it is a bacterial infection.  HOME CARE INSTRUCTIONS   · Rest the injured area for as long as directed by your caregiver. Then slowly start using the joint as directed by your caregiver and as the pain allows. Crutches as directed may be useful if the ankles, knees or hips are involved. If the knee was splinted or casted, continue use and care as directed. If an stretchy or elastic wrapping bandage has been applied today, it should be removed and re-applied every 3 to 4 hours. It should not be applied tightly, but firmly enough to keep swelling down. Watch toes and feet for swelling, bluish discoloration, coldness, numbness or excessive pain. If any of these problems (symptoms) occur, remove the ace bandage and re-apply more loosely. If these symptoms persist, contact your caregiver or return to this location.  · For the first 24 hours, keep the injured extremity elevated on pillows while lying down.  · Apply ice for 15-20 minutes to the sore joint every couple hours while awake for the first half day. Then 03-04 times per day for the first 48 hours. Put the ice in a plastic bag and place a towel between the bag of ice and your skin.  · Wear any splinting, casting, elastic bandage applications, or slings as instructed.  · Only take over-the-counter or prescription medicines for pain, discomfort, or fever as  directed by your caregiver. Do not use aspirin immediately after the injury unless instructed by your physician. Aspirin can cause increased bleeding and bruising of the tissues.  · If you were given crutches, continue to use them as instructed and do not resume weight bearing on the sore joint until instructed.  Persistent pain and inability to use the sore joint as directed for more than 2 to 3 days are warning signs indicating that you should see a caregiver for a follow-up visit as soon as possible. Initially, a hairline fracture (break in bone) may not be evident on X-rays. Persistent pain and swelling indicate that further evaluation, non-weight bearing or use of the joint (use of crutches or slings as instructed), or further X-rays are indicated. X-rays may sometimes not show a small fracture until a week or 10 days later. Make a follow-up appointment with your own caregiver or one to whom we have referred you. A radiologist (specialist in reading X-rays) may read your X-rays. Make sure you know how you are to obtain your X-ray results. Do not assume everything is normal if you do not hear from us.  SEEK MEDICAL CARE IF:  Bruising, swelling, or pain increases.  SEEK IMMEDIATE MEDICAL CARE IF:   · Your fingers or toes are numb or blue.  · The pain is not responding to medications and continues to stay the same or get worse.  · The pain in your joint becomes severe.  · You   develop a fever over 102° F (38.9° C).  · It becomes impossible to move or use the joint.  MAKE SURE YOU:   · Understand these instructions.  · Will watch your condition.  · Will get help right away if you are not doing well or get worse.  Document Released: 05/13/2005 Document Revised: 08/05/2011 Document Reviewed: 12/30/2007  [employer]® Patient Information ©2014 [employer].

## 2013-08-03 NOTE — ED Notes (Signed)
Pain from left shoulder to hand x3 days.  Also pain in left calf and knee.  Twitching, pain and some blurring in left eye. Symptoms are worse at night.  Sts "I can't sleep at night."

## 2015-08-31 ENCOUNTER — Emergency Department (HOSPITAL_BASED_OUTPATIENT_CLINIC_OR_DEPARTMENT_OTHER)
Admission: EM | Admit: 2015-08-31 | Discharge: 2015-08-31 | Disposition: A | Discharge status: Home / Self Care | Payer: No Typology Code available for payment source | Attending: Emergency Medicine | Admitting: Emergency Medicine

## 2015-08-31 ENCOUNTER — Encounter (HOSPITAL_BASED_OUTPATIENT_CLINIC_OR_DEPARTMENT_OTHER): Payer: Self-pay | Admitting: *Deleted

## 2015-08-31 DIAGNOSIS — F17210 Nicotine dependence, cigarettes, uncomplicated: Secondary | ICD-10-CM | POA: Insufficient documentation

## 2015-08-31 DIAGNOSIS — F329 Major depressive disorder, single episode, unspecified: Secondary | ICD-10-CM | POA: Insufficient documentation

## 2015-08-31 DIAGNOSIS — K029 Dental caries, unspecified: Secondary | ICD-10-CM

## 2015-08-31 DIAGNOSIS — M791 Myalgia: Secondary | ICD-10-CM | POA: Insufficient documentation

## 2015-08-31 MED ORDER — ACETAMINOPHEN 500 MG PO TABS
1000.0000 mg | ORAL_TABLET | Freq: Once | ORAL | Status: AC
  Administered 2015-08-31: 1000 mg via ORAL
  Filled 2015-08-31: qty 2

## 2015-08-31 MED ORDER — PENICILLIN V POTASSIUM 500 MG PO TABS: 500.0000 mg | ORAL_TABLET | Freq: Four times a day (QID) | ORAL | Status: AC

## 2015-08-31 NOTE — ED Notes (Signed)
Patient is alert and oriented x4.  She is complaining of dental pain with abscesses. She states that she has four teeth that need to be seen by a dentist but the right lateral Incisor in her main concern today.  She currently rates her pain 10 of 10.

## 2015-08-31 NOTE — ED Notes (Signed)
MD at bedside. 

## 2015-08-31 NOTE — ED Notes (Signed)
Dental pain, body aches, headache x 1 week

## 2015-08-31 NOTE — Discharge Instructions (Signed)
Dental Caries Take Tylenol as directed for pain. Call Dr. Lucky CowboyKnox today to schedule the next available appointment. Tell office staff that you were seen in the emergency department. Your blood pressure today is elevated at 161/93. [facility] or your local health department to get a primary care physician. Your blood pressure should be rechecked within the next 3 weeks. Ask your new primary care physician to help you to stop smoking as smoking contributes. Elevated blood pressure. Dental caries is tooth decay. This decay can cause a hole in teeth (cavity) that can get bigger and deeper over time. HOME CARE  Brush and floss your teeth. Do this at least two times a day.  Use a fluoride toothpaste.  Use a mouth rinse if told by your dentist or doctor.  Eat less sugary and starchy foods. Drink less sugary drinks.  Avoid snacking often on sugary and starchy foods. Avoid sipping often on sugary drinks.  Keep regular checkups and cleanings with your dentist.  Use fluoride supplements if told by your dentist or doctor.  Allow fluoride to be applied to teeth if told by your dentist or doctor.   This information is not intended to replace advice given to you by your health care provider. Make sure you discuss any questions you have with your health care provider.   Document Released: 02/20/2008 Document Revised: 06/03/2014 Document Reviewed: 05/15/2012 Else[employer] Interactive Patient Education [employer]2016 [employer]

## 2015-08-31 NOTE — ED Provider Notes (Signed)
CSN: [id number]     Arrival date & time 08/31/15  1012 History   First MD Initiated Contact with Patient 08/31/15 1030     Chief Complaint  Patient presents with  . Dental Pain  . Generalized Body Aches     (Consider location/radiation/quality/duration/timing/severity/associated sxs/prior Treatment) HPI Complains of toothache and multiple teeth for the past 1 month, becoming worse over the past 2 or 3 days. Other associated symptoms include diffuse myalgias. She's treated self with ibuprofen and Tylenol without relief. She denies any fever. Denies other associated symptoms. Denies lightheadedness. Nothing makes symptoms better or worse. Past Medical History  Diagnosis Date  . Reflux   . Depression with anxiety   . Migraine    Past Surgical History  Procedure Laterality Date  . Abdominal hysterectomy     No family history on file. Social History  Substance Use Topics  . Smoking status: Current Every Day Smoker -- 0.50 packs/day    Types: Cigarettes  . Smokeless tobacco: None  . Alcohol Use: No   OB History    No data available     Review of Systems  HENT: Positive for dental problem.   Musculoskeletal: Positive for myalgias.  All other systems reviewed and are negative.     Allergies  Vicodin  Home Medications   Prior to Admission medications   Medication Sig Start Date End Date Taking? Authorizing Provider  ibuprofen (ADVIL,MOTRIN) 800 MG tablet Take 1 tablet (800 mg total) by mouth 3 (three) times daily. 04/10/13   Elpidio AnisShari Upstill, PA-C  meloxicam (MOBIC) 7.5 MG tablet Take 1 tablet (7.5 mg total) by mouth daily. 08/03/13   Elson AreasLeslie K Sofia, PA-C  oxyCODONE-acetaminophen (PERCOCET/ROXICET) 5-325 MG per tablet Take 1 tablet by mouth every 4 (four) hours as needed for severe pain. 04/10/13   Elpidio AnisShari Upstill, PA-C  penicillin v potassium (VEETID) 500 MG tablet Take 1 tablet (500 mg total) by mouth 3 (three) times daily. 04/10/13   Shari Upstill, PA-C   BP 142/94 mmHg   Pulse 71  Temp(Src) 98.4 F (36.9 C) (Oral)  Resp 18  Ht 5\' 4"  (1.626 m)  Wt 149 lb (67.586 kg)  BMI 25.56 kg/m2  SpO2 96% Physical Exam  Constitutional: She appears well-developed and well-nourished.  HENT:  Head: Normocephalic and atraumatic.  Right Ear: External ear normal.  Left Ear: External ear normal.  Mouth/Throat: Oropharynx is clear and moist.  Widespread dental Decay and poor dentition no trismus. No fluctuance of gingiva.  Eyes: Conjunctivae are normal. Pupils are equal, round, and reactive to light.  Neck: Neck supple. No tracheal deviation present. No thyromegaly present.  Cardiovascular: Normal rate and regular rhythm.   No murmur heard. Pulmonary/Chest: Effort normal and breath sounds normal.  Abdominal: Soft. Bowel sounds are normal. She exhibits no distension. There is no tenderness.  Musculoskeletal: Normal range of motion. She exhibits no edema or tenderness.  Lymphadenopathy:    She has no cervical adenopathy.  Neurological: She is alert. Coordination normal.  Skin: Skin is warm and dry. No rash noted.  Psychiatric: She has a normal mood and affect.  Nursing note and vitals reviewed.   ED Course  Procedures (including critical care time) Labs Review Labs Reviewed - No data to display  Imaging Review No results found. I have personally reviewed and evaluated these images and lab results as part of my medical decision-making.   EKG Interpretation None      MDM  Plan prescription Pen-Vee K. Tylenol for pain Dental  referral Dr.Knox she is also referred to [facility] and community wellness Center or her local health department to get a primary care physician. Blood pressure recheck 3 weeks Diagnoses #1 dental pain #2 elevated blood pressure Final diagnoses:  None        Doug Sou, MD 08/31/15 1104

## 2015-08-31 NOTE — ED Notes (Signed)
Patient is alert and oriented x3.  She was given DC instructions and follow up visit instructions.  Patient gave verbal understanding. She was DC ambulatory under her own power to home.  V/S stable.  sHe was not showing any signs of distress on DC 

## 2015-11-08 ENCOUNTER — Encounter (HOSPITAL_BASED_OUTPATIENT_CLINIC_OR_DEPARTMENT_OTHER): Payer: Self-pay

## 2015-11-08 ENCOUNTER — Emergency Department (HOSPITAL_BASED_OUTPATIENT_CLINIC_OR_DEPARTMENT_OTHER)
Admission: EM | Admit: 2015-11-08 | Discharge: 2015-11-08 | Disposition: A | Discharge status: Home / Self Care | Payer: No Typology Code available for payment source | Attending: Emergency Medicine | Admitting: Emergency Medicine

## 2015-11-08 DIAGNOSIS — K0889 Other specified disorders of teeth and supporting structures: Secondary | ICD-10-CM | POA: Insufficient documentation

## 2015-11-08 DIAGNOSIS — F17210 Nicotine dependence, cigarettes, uncomplicated: Secondary | ICD-10-CM | POA: Insufficient documentation

## 2015-11-08 MED ORDER — PENICILLIN V POTASSIUM 500 MG PO TABS: 500.0000 mg | ORAL_TABLET | Freq: Four times a day (QID) | ORAL | Status: AC

## 2015-11-08 NOTE — ED Notes (Signed)
Pa  at bedside. 

## 2015-11-08 NOTE — ED Notes (Signed)
Left lower toothache x "months"-c/o woke with swelling today-NAD-steady gait

## 2015-11-08 NOTE — Discharge Instructions (Signed)
Community Resource Guide Dental °The [employer]’s “211” is a great source of information about community services available.  Access by dialing 2-1-1 from anywhere in [place], or by website -  www.nc211.org.  ° °Other Local Resources (Updated 05/2015) ° °Dental  Care °  °Services ° °  °Phone Number and Address  °Cost  °[facility] For children 0 - 52 years of age:  °• Cleaning °• Tooth brushing/flossing instruction °• Sealants, fillings, crowns °• Extractions °• Emergency treatment  [phone] °[address] °[place], [place] [address] Charges based on family income.  Medicaid and some insurance plans accepted.   °  °[facility] • Cleaning °• Sealants, fillings, crowns °• Extractions °• Emergency treatment [phone] °[address] °[place], [place] ° Pregnant women 18 years of age or older with a Medicaid card  °[facility] [place] • Cleaning °• Sealants, fillings, crowns °• Extractions °• Emergency treatment [phone] °[address] °[place], [place] Pregnant women 18 years of age or older with a Medicaid card  °[facility] For children 0 - 52 years of age:  °• Cleaning °• Tooth brushing/flossing instruction °• Sealants, fillings, crowns °• Extractions °• Emergency treatment °Limited orthodontic services for patients with Medicaid [phone] °[address] °[place], [place] [address] Medicaid and [place] Health Choice cover for children up to age 52 and pregnant women.  Parents of children up to age 52 without Medicaid pay a reduced fee at time of service.  °[facility] [place] For children 0 - 52 years of age:  °• Cleaning °• Tooth brushing/flossing instruction °• Sealants, fillings, crowns °• Extractions °• Emergency treatment °Limited orthodontic services for patients with Medicaid  [phone] °[address] °[place], [place].  Medicaid and [place] Health Choice cover for children up to age 52 and pregnant women.  Parents of children up to age 52 without Medicaid pay a reduced fee.  °[facility] • Cleaning °• Sealants, fillings, crowns °• Extractions ° °Hours: Tuesdays and Thursdays, 4:15 - 8 pm [phone] °[address] °[place], [place] [address] Services free of charge to [place] residents ages 18-64 who do not have health insurance, Medicare, Medicaid, or VA benefits and fall within federal poverty guidelines  °[facility] ° ° ° Provides dental care in addition to primary medical care, nutritional counseling, and pharmacy: °• Cleaning °• Sealants, fillings, crowns °• Extractions ° ° ° ° ° ° ° ° ° ° ° ° ° ° ° ° ° [phone] °[facility], [address] °[place], [place] ° °[phone] °[facility], [address] [place], [place] ° °[phone] °[facility] °[place], [place] ° °[phone] °[facility], [address] °[place], [place] ° °[phone] °[facility] °[address] °[place], [place] Accepts Medicaid, Medicare, most insurance.  Also provides services available to all with fees adjusted based on ability to pay.    °[facility] • Cleaning °• Tooth brushing/flossing instruction °• Sealants, fillings, crowns °• Extractions °• Emergency treatment °Hours: Tuesdays, Thursdays, and Fridays from 8 am to 5 pm by appointment only. [phone] °[address] °[place], [place] [address] [place] residents with Medicaid (depending on eligibility) and children with  Health Choice - call for more information.  °  [facility] • Extractions only ° °Hours: 2nd and 4th Thursday of each month from 6:30 am - 9 am.   [phone] ext. [phone] °[address] °[place], [place] [address] Ages 18 and older only.  Patients are seen on a first come, first served basis.  °[facility] • Cleanings °• Fillings °• Extractions °• Orthodontics °• Endodontics °• Implants/Crowns/Bridges °• Complete and partial dentures [phone] °[place], [place] Patients must complete an application for services.  There is often a waiting list.   ° °

## 2015-11-08 NOTE — ED Provider Notes (Signed)
CSN: [id number]     Arrival date & time 11/08/15  1243 History   First MD Initiated Contact with Patient 11/08/15 1354     Chief Complaint  Patient presents with  . Dental Pain   HPI   50 year old female presents today with complaints of dental pain. Patient reports left-sided dental pain. History of chronic dental pain and different regions. She reports swelling to the lateral gums. She denies any fever, difficulty swallowing breathing, swelling of the neck, Tylenol number any other areas. Patient does not have a primary care provider or dentist.  Past Medical History  Diagnosis Date  . Reflux   . Depression with anxiety   . Migraine    Past Surgical History  Procedure Laterality Date  . Abdominal hysterectomy     No family history on file. Social History  Substance Use Topics  . Smoking status: Current Every Day Smoker -- 0.50 packs/day    Types: Cigarettes  . Smokeless tobacco: None  . Alcohol Use: No   OB History    No data available     Review of Systems  All other systems reviewed and are negative.   Allergies  Review of patient's allergies indicates no known allergies.  Home Medications   Prior to Admission medications   Medication Sig Start Date End Date Taking? Authorizing Provider  ibuprofen (ADVIL,MOTRIN) 800 MG tablet Take 1 tablet (800 mg total) by mouth 3 (three) times daily. 04/10/13   Elpidio AnisShari Upstill, PA-C  penicillin v potassium (VEETID) 500 MG tablet Take 1 tablet (500 mg total) by mouth 4 (four) times daily. 11/08/15 11/15/15  Juliet Vasbinder, PA-C   BP 156/106 mmHg  Pulse 65  Temp(Src) 98.8 F (37.1 C) (Oral)  Resp 18  Ht 5\' 4"  (1.626 m)  Wt 66.225 kg  BMI 25.05 kg/m2  SpO2 97% Physical Exam  Constitutional: She is oriented to person, place, and time. She appears well-developed and well-nourished. No distress.  HENT:  Head: Normocephalic.  Mouth/Throat: Uvula is midline, oropharynx is clear and moist and mucous membranes are normal. No  oropharyngeal exudate, posterior oropharyngeal edema, posterior oropharyngeal erythema or tonsillar abscesses.  External exam shows no asymmetry of the jaw line or face, no signs of obvious swelling, edema, infection. Full active range of motion of the jaw. Neck is supple with full active range of motion, no tenderness to palpation of the soft tissues  TTP of left lower gumline  Gumline palpated no obvious signs of infection including warmth, redness, abscess, tenderness. Posterior oropharynx clear with no signs of infection, uvula is midline and rises with phonation, tonsils present and normal in size, symmetrical bilateral, tongue is normal soft touch with full active range of motion, floor mouth is soft nontender.  Eyes: Conjunctivae are normal. Pupils are equal, round, and reactive to light. Right eye exhibits no discharge. Left eye exhibits no discharge.  Neck: Normal range of motion. Neck supple. No JVD present. No tracheal deviation present. No thyromegaly present.  Pulmonary/Chest: No stridor.  Lymphadenopathy:    She has no cervical adenopathy.  Neurological: She is alert and oriented to person, place, and time.  Skin: Skin is warm and dry. No rash noted. She is not diaphoretic. No erythema. No pallor.  Psychiatric: She has a normal mood and affect. Her behavior is normal. Judgment and thought content normal.  Nursing note and vitals reviewed.   ED Course  Procedures (including critical care time) Labs Review Labs Reviewed - No data to display  Imaging Review  No results found. I have personally reviewed and evaluated these images and lab results as part of my medical decision-making.   EKG Interpretation None      MDM   Final diagnoses:  Pain, dental    Labs:   Imaging:  Consults:  Therapeutics:  Discharge Meds:   Assessment/Plan: 50 year old female presents today with uncomplicated dental pain. She has no signs of infectious etiology on exam. Patient reports  that she feels swelling, no appreciable swelling. Patient will be started on penicillin, instructed to use ibuprofen Tylenol as needed, follow up tomorrow resources. Patient verbalized understanding and agreement today's plan had no further questions or concerns        Eyvonne Mechanic, PA-C 11/08/15 1459  Leta Baptist, MD 11/15/15 [id number]

## 2018-01-29 ENCOUNTER — Emergency Department (HOSPITAL_BASED_OUTPATIENT_CLINIC_OR_DEPARTMENT_OTHER): Payer: Self-pay

## 2018-01-29 ENCOUNTER — Encounter (HOSPITAL_BASED_OUTPATIENT_CLINIC_OR_DEPARTMENT_OTHER): Payer: Self-pay | Admitting: *Deleted

## 2018-01-29 ENCOUNTER — Emergency Department (HOSPITAL_BASED_OUTPATIENT_CLINIC_OR_DEPARTMENT_OTHER)
Admission: EM | Admit: 2018-01-29 | Discharge: 2018-01-29 | Disposition: A | Discharge status: Home / Self Care | Payer: Self-pay | Attending: Emergency Medicine | Admitting: Emergency Medicine

## 2018-01-29 ENCOUNTER — Other Ambulatory Visit: Payer: Self-pay

## 2018-01-29 DIAGNOSIS — M62838 Other muscle spasm: Secondary | ICD-10-CM | POA: Insufficient documentation

## 2018-01-29 DIAGNOSIS — M79662 Pain in left lower leg: Secondary | ICD-10-CM | POA: Insufficient documentation

## 2018-01-29 DIAGNOSIS — M62831 Muscle spasm of calf: Secondary | ICD-10-CM | POA: Insufficient documentation

## 2018-01-29 DIAGNOSIS — F17210 Nicotine dependence, cigarettes, uncomplicated: Secondary | ICD-10-CM | POA: Insufficient documentation

## 2018-01-29 DIAGNOSIS — R079 Chest pain, unspecified: Secondary | ICD-10-CM | POA: Insufficient documentation

## 2018-01-29 LAB — BASIC METABOLIC PANEL
ANION GAP: 9 (ref 5–15)
BUN: 12 mg/dL (ref 6–20)
CHLORIDE: 105 mmol/L (ref 98–111)
CO2: 25 mmol/L (ref 22–32)
CREATININE: 0.6 mg/dL (ref 0.44–1.00)
Calcium: 9.3 mg/dL (ref 8.9–10.3)
GLUCOSE: 97 mg/dL (ref 70–99)
POTASSIUM: 3.8 mmol/L (ref 3.5–5.1)
SODIUM: 139 mmol/L (ref 135–145)

## 2018-01-29 LAB — CBC
HEMATOCRIT: 39.2 % (ref 36.0–46.0)
Hemoglobin: 12.9 g/dL (ref 12.0–15.0)
MCH: 28 pg (ref 26.0–34.0)
MCHC: 32.9 g/dL (ref 30.0–36.0)
MCV: 85 fL (ref 78.0–100.0)
Platelets: 258 10*3/uL (ref 150–400)
RBC: 4.61 MIL/uL (ref 3.87–5.11)
RDW: 13.7 % (ref 11.5–15.5)
WBC: 4.7 10*3/uL (ref 4.0–10.5)

## 2018-01-29 LAB — TROPONIN I

## 2018-01-29 MED ORDER — AMLODIPINE BESYLATE 5 MG PO TABS
10.00 | ORAL_TABLET | ORAL | Status: DC
Start: 2018-01-28 — End: 2018-01-29

## 2018-01-29 MED ORDER — HYDRALAZINE HCL 20 MG/ML IJ SOLN
10.00 | INTRAMUSCULAR | Status: DC
Start: ? — End: 2018-01-29

## 2018-01-29 MED ORDER — NICOTINE 14 MG/24HR TD PT24
1.00 | MEDICATED_PATCH | TRANSDERMAL | Status: DC
Start: 2018-01-28 — End: 2018-01-29

## 2018-01-29 MED ORDER — HEPARIN SODIUM (PORCINE) 5000 UNIT/ML IJ SOLN
5000.00 | INTRAMUSCULAR | Status: DC
Start: 2018-01-27 — End: 2018-01-29

## 2018-01-29 MED ORDER — NITROGLYCERIN 0.4 MG SL SUBL
0.40 | SUBLINGUAL_TABLET | SUBLINGUAL | Status: DC
Start: ? — End: 2018-01-29

## 2018-01-29 MED ORDER — ASPIRIN 81 MG PO CHEW
81.00 | CHEWABLE_TABLET | ORAL | Status: DC
Start: 2018-01-28 — End: 2018-01-29

## 2018-01-29 MED ORDER — ONDANSETRON HCL 4 MG/2ML IJ SOLN
4.00 | INTRAMUSCULAR | Status: DC
Start: ? — End: 2018-01-29

## 2018-01-29 MED ORDER — ALUMINUM-MAGNESIUM-SIMETHICONE 200-200-20 MG/5ML PO SUSP
30.00 | ORAL | Status: DC
Start: ? — End: 2018-01-29

## 2018-01-29 MED ORDER — ACETAMINOPHEN 325 MG PO TABS
650.00 | ORAL_TABLET | ORAL | Status: DC
Start: ? — End: 2018-01-29

## 2018-01-29 NOTE — ED Notes (Signed)
States she has been at [facility] for the past 5 hours waiting to been seen but left without being seen.

## 2018-01-29 NOTE — Discharge Instructions (Addendum)
We think your leg and shoulder pain are muscle spasm.  We are waiting for the results of the [place] we did for your leg.  Please talk to your primary care doctor about a medicine to help with muscle spasms.

## 2018-01-29 NOTE — ED Notes (Signed)
Patient transported to Ultrasound 

## 2018-01-29 NOTE — ED Notes (Signed)
Pt is adamant about wanting to leave at this time stating that her ride is about to leave her. Pt advised her workup was not complete and it could cause harm to leave without waiting for the results. EDP notified and is at bedside at this time.

## 2018-01-29 NOTE — ED Provider Notes (Signed)
[facility] EMERGENCY DEPARTMENT Provider Note   CSN: [id number] Arrival date & time: 01/29/18  1900   History   Chief Complaint Chief Complaint  Patient presents with  . Chest Pain    HPI Elaine Baker is a 52 y.o. female.  HPI Patient presents to ED today because when she got a followu call from a recent chest pain admission she told the caller that she had some pain in her leg and was advised to come to the ED for a "scan".  She says her chest pain has resolved and she was told by the doctors there that everything was fine besides her blood pressure.  She had not been taking her lisinopril for ~12yrs because she had no doctor and thinks she was sent out on losartan which she has been taking.  She says her only pains are muscle spasms that she has been having intermittently for years.  Her current ones on left deltoid and left calf.  She denies any chest pain at this time.  She denies any shortness of breathe at this time.  She says her c/o shortness of breath are not lung related and more in her sinuses, she says this is a chronic problem and not acute.  She states that if she breathes through her mouth she breathes just fine.  She denies any swelling or assymetric loss of senstion/motor control in left leg.  She thinks the caller who advised her to come to the ED overreacted and she spent hours at another ED before getting frustrated with the wait and coming here.   Past Medical History:  Diagnosis Date  . Depression with anxiety   . Migraine   . Reflux     There are no active problems to display for this patient.   Past Surgical History:  Procedure Laterality Date  . ABDOMINAL HYSTERECTOMY       OB History   None      Home Medications    Prior to Admission medications   Medication Sig Start Date End Date Taking? Authorizing Provider  ibuprofen (ADVIL,MOTRIN) 800 MG tablet Take 1 tablet (800 mg total) by mouth 3 (three) times daily. 04/10/13   Elpidio Anis, PA-C    Family History No family history on file.  Social History Social History   Tobacco Use  . Smoking status: Current Every Day Smoker    Packs/day: 0.50    Types: Cigarettes  . Smokeless tobacco: Never Used  Substance Use Topics  . Alcohol use: No  . Drug use: No     Allergies   Patient has no known allergies.   Review of Systems Review of Systems  Constitutional: Negative for fever.  HENT: Negative for congestion, sinus pain and sore throat.   Eyes: Negative for pain and redness.  Respiratory: Negative for cough, choking, chest tightness, shortness of breath and wheezing.   Cardiovascular: Negative for chest pain and leg swelling.  Gastrointestinal: Negative for constipation, nausea and vomiting.  Genitourinary: Negative for dysuria.  Musculoskeletal: Positive for arthralgias. Negative for joint swelling.       Muscle "spasm" to left deltoid and left calf  Skin: Negative for rash and wound.  Neurological: Negative for facial asymmetry and speech difficulty.  Psychiatric/Behavioral: Negative for behavioral problems.     Physical Exam Updated Vital Signs BP (!) 185/99 (BP Location: Right Arm)   Pulse 64   Temp 98.4 F (36.9 C) (Oral)   Resp 16   Ht 5\' 4"  (1.626  m)   Wt 68 kg   SpO2 98%   BMI 25.75 kg/m   Physical Exam  Constitutional: She appears well-developed and well-nourished.  Non-toxic appearance. She does not appear ill. No distress.  HENT:  Head: Normocephalic.  Cardiovascular: Normal rate, regular rhythm and normal pulses.  Murmur heard. Pulmonary/Chest: Effort normal and breath sounds normal. No accessory muscle usage or stridor. No respiratory distress. She has no decreased breath sounds. She has no wheezes.  Abdominal: Soft. There is no tenderness. There is no guarding.  Musculoskeletal:       Right lower leg: Normal.       Left lower leg: She exhibits tenderness.  Positive holman on left  Neurological: She is alert.  Skin:  Skin is warm and dry. No rash noted. No erythema.  Psychiatric: She has a normal mood and affect. Her behavior is normal. Her mood appears not anxious. She is not agitated.     ED Treatments / Results  Labs (all labs ordered are listed, but only abnormal results are displayed) Labs Reviewed  BASIC METABOLIC PANEL  CBC  TROPONIN I    EKG EKG Interpretation  Date/Time:  Thursday January 29 2018 19:16:13 EDT Ventricular Rate:  64 PR Interval:  166 QRS Duration: 84 QT Interval:  434 QTC Calculation: 447 R Axis:   25 Text Interpretation:  Normal sinus rhythm Nonspecific T wave abnormality Abnormal ECG No STEMI Confirmed by Alona Bene [phone]) on 01/29/2018 7:18:58 PM   Radiology Dg Chest 2 View  Result Date: 01/29/2018 CLINICAL DATA:  Shortness of breath and chest tightness. EXAM: CHEST - 2 VIEW COMPARISON:  None. FINDINGS: The heart size and mediastinal contours are within normal limits. The aorta is tortuous. Both lungs are clear. The visualized skeletal structures are unremarkable. IMPRESSION: No active cardiopulmonary disease. Electronically Signed   By: Sherian Rein M.D.   On: 01/29/2018 19:50   US Venous Img Lower  Left (dvt Study)  Result Date: 01/29/2018 CLINICAL DATA:  Left lower extremity pain and edema EXAM: Left LOWER EXTREMITY VENOUS DOPPLER ULTRASOUND TECHNIQUE: Gray-scale sonography with graded compression, as well as color Doppler and duplex ultrasound were performed to evaluate the lower extremity deep venous systems from the level of the common femoral vein and including the common femoral, femoral, profunda femoral, popliteal and calf veins including the posterior tibial, peroneal and gastrocnemius veins when visible. The superficial great saphenous vein was also interrogated. Spectral Doppler was utilized to evaluate flow at rest and with distal augmentation maneuvers in the common femoral, femoral and popliteal veins. COMPARISON:  None. FINDINGS: Contralateral Common  Femoral Vein: Respiratory phasicity is normal and symmetric with the symptomatic side. No evidence of thrombus. Normal compressibility. Common Femoral Vein: No evidence of thrombus. Normal compressibility, respiratory phasicity and response to augmentation. Saphenofemoral Junction: No evidence of thrombus. Normal compressibility and flow on color Doppler imaging. Profunda Femoral Vein: No evidence of thrombus. Normal compressibility and flow on color Doppler imaging. Femoral Vein: No evidence of thrombus. Normal compressibility, respiratory phasicity and response to augmentation. Popliteal Vein: No evidence of thrombus. Normal compressibility, respiratory phasicity and response to augmentation. Calf Veins: No evidence of thrombus. Normal compressibility and flow on color Doppler imaging. Superficial Great Saphenous Vein: No evidence of thrombus. Normal compressibility. Other Findings: Nonspecific mildly prominent left groin lymph nodes. IMPRESSION: No evidence of deep venous thrombosis. Electronically Signed   By: Jasmine Pang M.D.   On: 01/29/2018 23:06    Procedures Procedures (including critical care time)  Medications  Ordered in ED Medications - No data to display   Initial Impression / Assessment and Plan / ED Course  I have reviewed the triage vital signs and the nursing notes.  Pertinent labs & imaging results that were available during my care of the patient were reviewed by me and considered in my medical decision making (see chart for details).  8:30pm  Neg trop and ECG with recent full cardiac workup inpatient.  Also with hypertension w/ sbp 160s on (losartan?).  Story and exam do not fit PE and there is low clinical sign of dvt but will order DVT US.  Patient left prior DVT US results.  Test was neg for dvt, attempted to call patient but no pickup and no voicemail option  Final Clinical Impressions(s) / ED Diagnoses   Final diagnoses:  Muscle spasm    ED Discharge Orders     None       Marthenia Rolling, DO 01/29/18 2319    Maia Plan, MD 01/30/18 1135

## 2018-01-29 NOTE — ED Triage Notes (Signed)
Chest pain. She was discharged from [facility] with same 2 days ago.

## 2018-06-24 ENCOUNTER — Encounter (HOSPITAL_BASED_OUTPATIENT_CLINIC_OR_DEPARTMENT_OTHER): Payer: Self-pay

## 2018-06-24 ENCOUNTER — Emergency Department (HOSPITAL_BASED_OUTPATIENT_CLINIC_OR_DEPARTMENT_OTHER)
Admission: EM | Admit: 2018-06-24 | Discharge: 2018-06-24 | Disposition: A | Discharge status: Home / Self Care | Payer: Self-pay | Attending: Emergency Medicine | Admitting: Emergency Medicine

## 2018-06-24 ENCOUNTER — Other Ambulatory Visit: Payer: Self-pay

## 2018-06-24 DIAGNOSIS — Z760 Encounter for issue of repeat prescription: Secondary | ICD-10-CM

## 2018-06-24 DIAGNOSIS — I10 Essential (primary) hypertension: Secondary | ICD-10-CM

## 2018-06-24 DIAGNOSIS — F17210 Nicotine dependence, cigarettes, uncomplicated: Secondary | ICD-10-CM | POA: Insufficient documentation

## 2018-06-24 DIAGNOSIS — F419 Anxiety disorder, unspecified: Secondary | ICD-10-CM | POA: Insufficient documentation

## 2018-06-24 DIAGNOSIS — F329 Major depressive disorder, single episode, unspecified: Secondary | ICD-10-CM | POA: Insufficient documentation

## 2018-06-24 HISTORY — DX: Essential (primary) hypertension: I10

## 2018-06-24 MED ORDER — ACETAMINOPHEN 325 MG PO TABS
650.0000 mg | ORAL_TABLET | Freq: Once | ORAL | Status: AC
  Administered 2018-06-24: 650 mg via ORAL
  Filled 2018-06-24: qty 2

## 2018-06-24 MED ORDER — AMLODIPINE BESYLATE 5 MG PO TABS
5.0000 mg | ORAL_TABLET | Freq: Once | ORAL | Status: AC
  Administered 2018-06-24: 5 mg via ORAL
  Filled 2018-06-24: qty 1

## 2018-06-24 MED ORDER — AMLODIPINE BESYLATE 5 MG PO TABS: 5.0000 mg | ORAL_TABLET | Freq: Every day | ORAL | 0 refills | Status: DC

## 2018-06-24 NOTE — ED Triage Notes (Signed)
Pt c/o elevated BP--out of meds x 3 weeks-states she does not have PCP-last rx from [facility] ED-NAD-steady gait

## 2018-06-24 NOTE — ED Provider Notes (Signed)
[facility] EMERGENCY DEPARTMENT Provider Note   CSN: [id number] Arrival date & time: 06/24/18  1708     History   Chief Complaint Chief Complaint  Patient presents with  . Hypertension    HPI Elaine KocherRegina Baker is a 53 y.o. female with a past medical history of hypertension, migraines who presents to ED for high blood pressure and headache.  She ran out of her amlodipine 3 weeks ago.  She states that "it made me feel sick so I stopped taking it."  States that she will have a headache sometimes when her blood pressure is high.  She reports associated blurry vision which is also typical of her headaches.  Denies any head injuries or falls, numbness in arms or legs, fever, neck pain, shortness of breath.  HPI  Past Medical History:  Diagnosis Date  . Depression with anxiety   . Hypertension   . Migraine   . Reflux     There are no active problems to display for this patient.   Past Surgical History:  Procedure Laterality Date  . ABDOMINAL HYSTERECTOMY       OB History   No obstetric history on file.      Home Medications    Prior to Admission medications   Medication Sig Start Date End Date Taking? Authorizing Provider  amLODipine (NORVASC) 5 MG tablet Take 1 tablet (5 mg total) by mouth daily. 06/24/18   Craigory Toste, PA-C  ibuprofen (ADVIL,MOTRIN) 800 MG tablet Take 1 tablet (800 mg total) by mouth 3 (three) times daily. 04/10/13   Elpidio AnisUpstill, Shari, PA-C    Family History No family history on file.  Social History Social History   Tobacco Use  . Smoking status: Current Every Day Smoker    Packs/day: 0.50    Types: Cigarettes  . Smokeless tobacco: Never Used  Substance Use Topics  . Alcohol use: No  . Drug use: No     Allergies   Patient has no known allergies.   Review of Systems Review of Systems  Constitutional: Negative for chills and fever.  HENT: Positive for sinus pressure.   Eyes: Positive for visual disturbance.  Respiratory:  Negative for cough and shortness of breath.   Gastrointestinal: Negative for vomiting.  Neurological: Positive for headaches.     Physical Exam Updated Vital Signs BP (!) 169/100 (BP Location: Left Arm)   Pulse (!) 57   Temp 97.8 F (36.6 C) (Oral)   Resp 16   Ht 5\' 4"  (1.626 m)   Wt 68.5 kg   SpO2 100%   BMI 25.92 kg/m   Physical Exam Vitals signs and nursing note reviewed.  Constitutional:      General: She is not in acute distress.    Appearance: She is well-developed. She is not diaphoretic.  HENT:     Head: Normocephalic and atraumatic.  Eyes:     General: No scleral icterus.    Conjunctiva/sclera: Conjunctivae normal.     Pupils: Pupils are equal, round, and reactive to light.  Neck:     Musculoskeletal: Normal range of motion.  Cardiovascular:     Rate and Rhythm: Normal rate and regular rhythm.     Heart sounds: Normal heart sounds.  Pulmonary:     Effort: Pulmonary effort is normal. No respiratory distress.  Skin:    Findings: No rash.  Neurological:     General: No focal deficit present.     Mental Status: She is alert and oriented to person, place,  and time.     Cranial Nerves: No cranial nerve deficit.     Sensory: No sensory deficit.     Motor: No weakness.     Coordination: Coordination normal.      ED Treatments / Results  Labs (all labs ordered are listed, but only abnormal results are displayed) Labs Reviewed - No data to display  EKG None  Radiology No results found.  Procedures Procedures (including critical care time)  Medications Ordered in ED Medications  amLODipine (NORVASC) tablet 5 mg (5 mg Oral Given 06/24/18 1855)  acetaminophen (TYLENOL) tablet 650 mg (650 mg Oral Given 06/24/18 1855)     Initial Impression / Assessment and Plan / ED Course  I have reviewed the triage vital signs and the nursing notes.  Pertinent labs & imaging results that were available during my care of the patient were reviewed by me and  considered in my medical decision making (see chart for details).     53 year old female presents to ED for high blood pressure.  She discontinued her amlodipine after she ran out 3 days ago.  Reports having headache when she has high blood pressure.  She does not currently have a PCP.  Patient hypertensive to 169/100 here.  Other vital signs are within normal limits.  No deficits on neurological exam noted.  Will give dose of amlodipine 5 mg, Tylenol and reassess.  7:04 PM Patient requesting discharge.  She received 5 mg of amlodipine and Tylenol.  Will give refill of amlodipine for 2 weeks but did encourage her to establish care with her primary care provider for further medication refills. There are no headache characteristics that are lateralizing or concerning for increased ICP, infectious or vascular cause of her symptoms.  Patient is hemodynamically stable, in NAD, and able to ambulate in the ED. Evaluation does not show pathology that would require ongoing emergent intervention or inpatient treatment. I explained the diagnosis to the patient. Pain has been managed and has no complaints prior to discharge. Patient is comfortable with above plan and is stable for discharge at this time. All questions were answered prior to disposition. Strict return precautions for returning to the ED were discussed. Encouraged follow up with PCP.    Portions of this note were generated with Scientist, clinical (histocompatibility and immunogenetics). Dictation errors may occur despite best attempts at proofreading.   Final Clinical Impressions(s) / ED Diagnoses   Final diagnoses:  Hypertension, unspecified type  Encounter for medication refill    ED Discharge Orders         Ordered    amLODipine (NORVASC) 5 MG tablet  Daily     06/24/18 1855           Dietrich Pates, PA-C 06/24/18 Rosana Fret, MD 06/24/18 [phone]

## 2018-06-24 NOTE — ED Notes (Signed)
ED Provider at bedside. 

## 2018-06-24 NOTE — Discharge Instructions (Signed)
Please continue your amlodipine to help with your high blood pressure. Uncontrolled high blood pressure can lead to severe illness such as a stroke. You will need to follow-up and establish care with a primary care provider for further medication refills as the emergency room is not the appropriate place for refills of your medications. Return to ED for chest pain, shortness of breath, increasing headache or blurry vision, numbness in arms or legs.

## 2018-07-14 ENCOUNTER — Other Ambulatory Visit: Payer: Self-pay

## 2018-07-20 ENCOUNTER — Ambulatory Visit: Payer: Self-pay | Admitting: Family Medicine

## 2018-12-29 ENCOUNTER — Other Ambulatory Visit: Payer: Self-pay

## 2018-12-29 ENCOUNTER — Emergency Department (HOSPITAL_BASED_OUTPATIENT_CLINIC_OR_DEPARTMENT_OTHER)
Admission: EM | Admit: 2018-12-29 | Discharge: 2018-12-29 | Disposition: A | Discharge status: Home / Self Care | Payer: HRSA Program | Attending: Emergency Medicine | Admitting: Emergency Medicine

## 2018-12-29 ENCOUNTER — Encounter (HOSPITAL_BASED_OUTPATIENT_CLINIC_OR_DEPARTMENT_OTHER): Payer: Self-pay | Admitting: Emergency Medicine

## 2018-12-29 DIAGNOSIS — F17210 Nicotine dependence, cigarettes, uncomplicated: Secondary | ICD-10-CM | POA: Insufficient documentation

## 2018-12-29 DIAGNOSIS — I10 Essential (primary) hypertension: Secondary | ICD-10-CM | POA: Insufficient documentation

## 2018-12-29 DIAGNOSIS — R197 Diarrhea, unspecified: Secondary | ICD-10-CM | POA: Insufficient documentation

## 2018-12-29 DIAGNOSIS — Z20828 Contact with and (suspected) exposure to other viral communicable diseases: Secondary | ICD-10-CM | POA: Insufficient documentation

## 2018-12-29 DIAGNOSIS — R112 Nausea with vomiting, unspecified: Secondary | ICD-10-CM | POA: Insufficient documentation

## 2018-12-29 HISTORY — DX: Nasal congestion: R09.81

## 2018-12-29 MED ORDER — AMLODIPINE BESYLATE 10 MG PO TABS: 5.0000 mg | ORAL_TABLET | Freq: Every day | ORAL | 0 refills | Status: DC

## 2018-12-29 NOTE — ED Triage Notes (Addendum)
Pt has been out of bp med x2 weeks. Pt went to work 2 days ago and they sent her home because she had diarrhea.  The diarrhea only lasted for 24 hours and she does not have it now, but she cannot go back until she gets tested for Covid.  Pt sts she cannot find anywhere to have it done.  Has gained 8 lbs in the past couple weeks and sts she is hungry "all the time".

## 2018-12-29 NOTE — ED Provider Notes (Signed)
[facility] EMERGENCY DEPARTMENT Provider Note   CSN: [id number] Arrival date & time: 12/29/18  0941    History   Chief Complaint Chief Complaint  Patient presents with  . Hypertension    HPI Elaine Baker is a 53 y.o. female.     53 yo F with a chief complaint of needing a coronavirus test to return back to work.  The patient states that she had nausea vomiting and diarrhea over the weekend which is resolved.  Had some mild abdominal cramping with it which is also resolved.  No fevers or chills no cough or shortness of breath.  No exposure to someone with the novel coronavirus.  The patient also has a history of hypertension.  She is on amlodipine at home.  Has been out of her medication for the past couple months.  Does not have a family doctor.  The history is provided by the patient.  Hypertension This is a new problem. The current episode started 2 days ago. The problem occurs rarely. The problem has been resolved. Associated symptoms include abdominal pain. Pertinent negatives include no chest pain, no headaches and no shortness of breath. Nothing aggravates the symptoms. Nothing relieves the symptoms. She has tried nothing for the symptoms. The treatment provided no relief.    Past Medical History:  Diagnosis Date  . Depression with anxiety   . GERD (gastroesophageal reflux disease)   . Hypertension   . Migraine   . Sinus congestion     There are no active problems to display for this patient.   Past Surgical History:  Procedure Laterality Date  . ABDOMINAL HYSTERECTOMY       OB History   No obstetric history on file.      Home Medications    Prior to Admission medications   Medication Sig Start Date End Date Taking? Authorizing Provider  amLODipine (NORVASC) 10 MG tablet Take 0.5 tablets (5 mg total) by mouth daily. 12/29/18   Melene PlanFloyd, Elias Dennington, DO    Family History Family History  Problem Relation Age of Onset  . Bone cancer Mother   .  Hypertension Mother   . Heart disease Mother   . Kidney disease Mother   . Arthritis Mother   . Cirrhosis Father     Social History Social History   Tobacco Use  . Smoking status: Current Every Day Smoker    Packs/day: 0.50    Types: Cigarettes  . Smokeless tobacco: Never Used  Substance Use Topics  . Alcohol use: No  . Drug use: No     Allergies   Patient has no known allergies.   Review of Systems Review of Systems  Constitutional: Negative for chills and fever.  HENT: Negative for congestion and rhinorrhea.   Eyes: Negative for redness and visual disturbance.  Respiratory: Negative for shortness of breath and wheezing.   Cardiovascular: Negative for chest pain and palpitations.  Gastrointestinal: Positive for abdominal pain. Negative for nausea and vomiting.  Genitourinary: Negative for dysuria and urgency.  Musculoskeletal: Negative for arthralgias and myalgias.  Skin: Negative for pallor and wound.  Neurological: Negative for dizziness and headaches.     Physical Exam Updated Vital Signs BP (!) 163/96 (BP Location: Left Arm)   Pulse (!) 59   Temp 98 F (36.7 C) (Oral)   Resp 16   Ht 5\' 4"  (1.626 m)   Wt 68.7 kg   SpO2 98%   BMI 26.00 kg/m   Physical Exam Vitals signs and nursing  note reviewed.  Constitutional:      General: She is not in acute distress.    Appearance: She is well-developed. She is not diaphoretic.  HENT:     Head: Normocephalic and atraumatic.  Eyes:     Pupils: Pupils are equal, round, and reactive to light.  Neck:     Musculoskeletal: Normal range of motion and neck supple.  Cardiovascular:     Rate and Rhythm: Normal rate and regular rhythm.     Heart sounds: No murmur. No friction rub. No gallop.   Pulmonary:     Effort: Pulmonary effort is normal.     Breath sounds: No wheezing or rales.  Abdominal:     General: There is no distension.     Palpations: Abdomen is soft.     Tenderness: There is no abdominal tenderness.   Musculoskeletal:        General: No tenderness.  Skin:    General: Skin is warm and dry.  Neurological:     Mental Status: She is alert and oriented to person, place, and time.  Psychiatric:        Behavior: Behavior normal.      ED Treatments / Results  Labs (all labs ordered are listed, but only abnormal results are displayed) Labs Reviewed  NOVEL CORONAVIRUS, NAA (HOSPITAL ORDER, SEND-OUT TO REF LAB)    EKG None  Radiology No results found.  Procedures Procedures (including critical care time)  Medications Ordered in ED Medications - No data to display   Initial Impression / Assessment and Plan / ED Course  I have reviewed the triage vital signs and the nursing notes.  Pertinent labs & imaging results that were available during my care of the patient were reviewed by me and considered in my medical decision making (see chart for details).        53 yo F with chief complaints of needing coronavirus test to return to work.  She also is out of her home amlodipine.  No symptoms concerning for hypertensive emergency.  We will start her back on her home medication.  Outpatient coronavirus test sent.  Elaine Baker was evaluated in Emergency Department on 12/29/2018 for the symptoms described in the history of present illness. He/she was evaluated in the context of the global COVID-19 pandemic, which necessitated consideration that the patient might be at risk for infection with the SARS-CoV-2 virus that causes COVID-19. Institutional protocols and algorithms that pertain to the evaluation of patients at risk for COVID-19 are in a state of rapid change based on information released by regulatory bodies including the CDC and federal and state organizations. These policies and algorithms were followed during the patient's care in the ED.  10:51 AM:  I have discussed the diagnosis/risks/treatment options with the patient and believe the pt to be eligible for discharge home to  follow-up with PCP. We also discussed returning to the ED immediately if new or worsening sx occur. We discussed the sx which are most concerning (e.g., sudden worsening pain, fever, inability to tolerate by mouth) that necessitate immediate return. Medications administered to the patient during their visit and any new prescriptions provided to the patient are listed below.  Medications given during this visit Medications - No data to display   The patient appears reasonably screen and/or stabilized for discharge and I doubt any other medical condition or other [facility] requiring further screening, evaluation, or treatment in the ED at this time prior to discharge.   Final Clinical  Impressions(s) / ED Diagnoses   Final diagnoses:  Essential hypertension  Nausea vomiting and diarrhea    ED Discharge Orders         Ordered    amLODipine (NORVASC) 10 MG tablet  Daily     12/29/18 [place], Trace Wirick, DO 12/29/18 1051

## 2018-12-29 NOTE — Discharge Instructions (Addendum)
Take your blood pressure medications as prescribed.  Please follow-up with your family doctor.  Your test for the coronavirus should come back in about 2 days.  They usually will not call you unless it comes back as a positive test.

## 2018-12-29 NOTE — ED Notes (Signed)
ED Provider at bedside. 

## 2018-12-30 LAB — NOVEL CORONAVIRUS, NAA (HOSP ORDER, SEND-OUT TO REF LAB; TAT 18-24 HRS): SARS-CoV-2, NAA: NOT DETECTED

## 2018-12-31 ENCOUNTER — Telehealth: Payer: Self-pay | Admitting: General Practice

## 2018-12-31 NOTE — Telephone Encounter (Signed)
Pt received negative covid test results  °

## 2019-05-26 IMAGING — DX DG CHEST 2V
2 series · 2 of 2 positions shown · non-contrast
Comparison: None.

CLINICAL DATA: Shortness of breath and chest tightness.

EXAM:
CHEST - 2 VIEW

[chest pa]
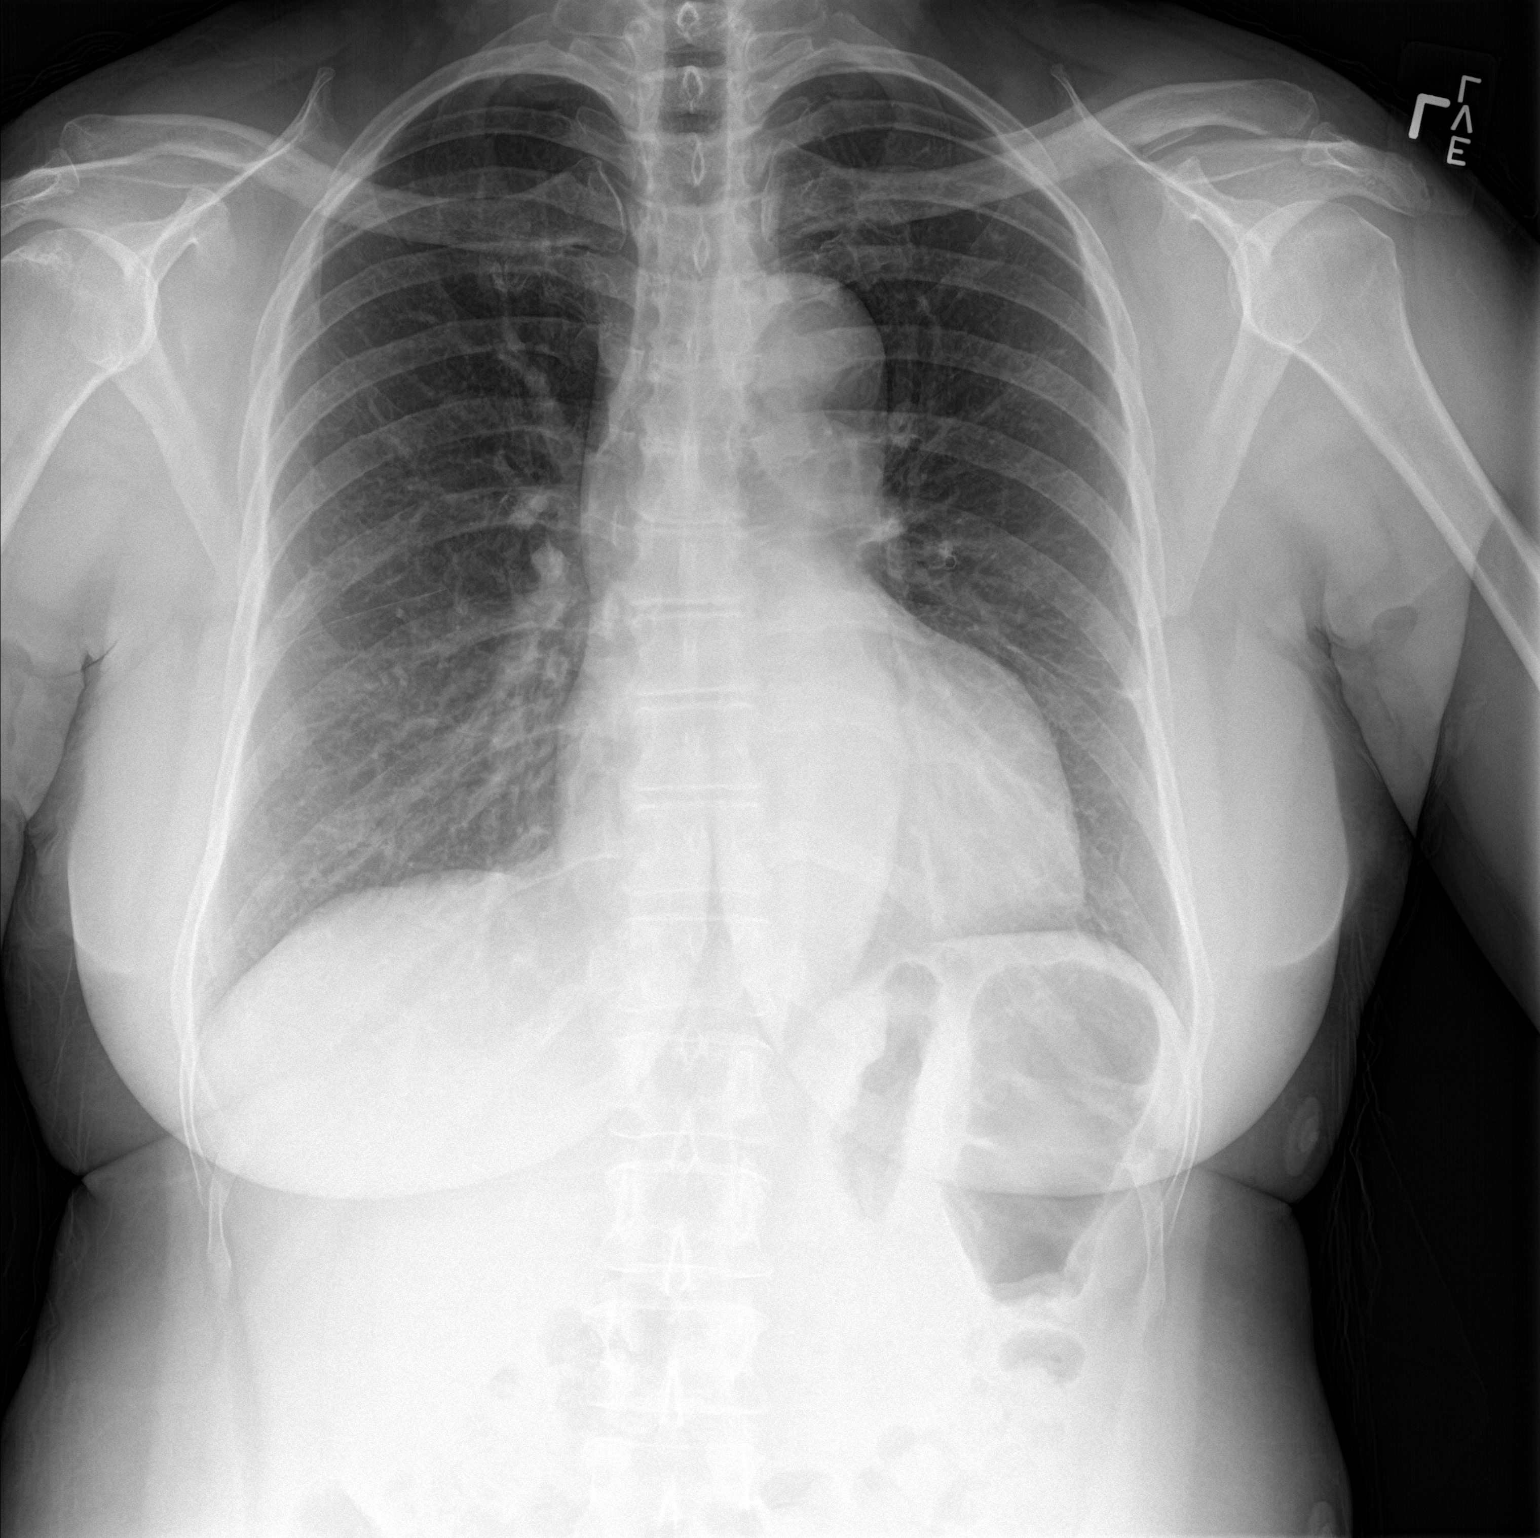

[chest lat]
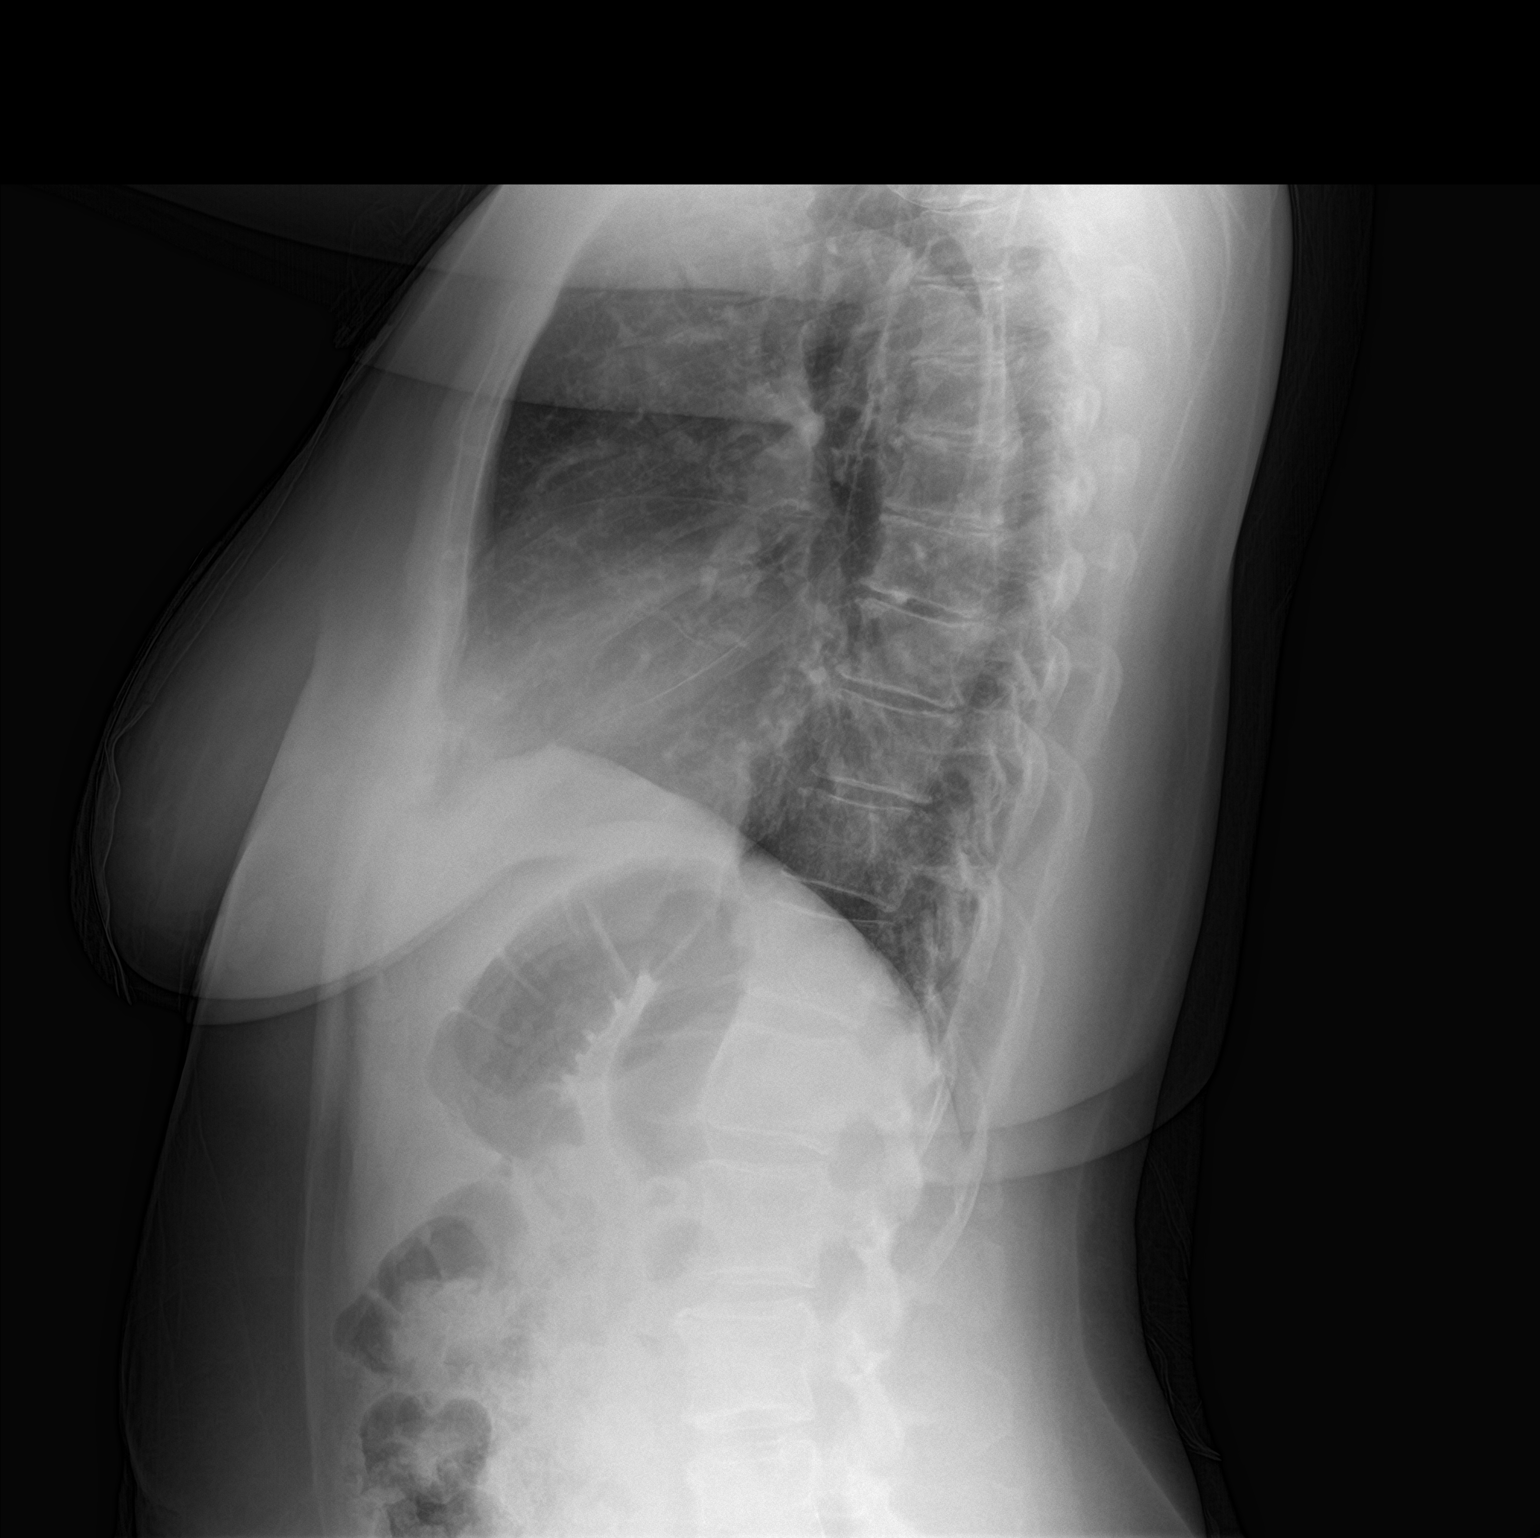

[2 of 2 positions shown; findings below may reference images not displayed]

FINDINGS: The heart size and mediastinal contours are within normal limits.
The aorta is tortuous. Both lungs are clear. The visualized skeletal
structures are unremarkable.
IMPRESSION: No active cardiopulmonary disease.

## 2019-07-05 ENCOUNTER — Other Ambulatory Visit: Payer: Self-pay

## 2019-07-05 ENCOUNTER — Emergency Department (HOSPITAL_COMMUNITY)
Admission: EM | Admit: 2019-07-05 | Discharge: 2019-07-05 | Disposition: A | Discharge status: Home / Self Care | Payer: Self-pay | Attending: Emergency Medicine | Admitting: Emergency Medicine

## 2019-07-05 ENCOUNTER — Encounter (HOSPITAL_COMMUNITY): Payer: Self-pay | Admitting: Emergency Medicine

## 2019-07-05 DIAGNOSIS — R519 Headache, unspecified: Secondary | ICD-10-CM | POA: Insufficient documentation

## 2019-07-05 DIAGNOSIS — K029 Dental caries, unspecified: Secondary | ICD-10-CM | POA: Insufficient documentation

## 2019-07-05 DIAGNOSIS — R1010 Upper abdominal pain, unspecified: Secondary | ICD-10-CM | POA: Insufficient documentation

## 2019-07-05 DIAGNOSIS — I10 Essential (primary) hypertension: Secondary | ICD-10-CM | POA: Insufficient documentation

## 2019-07-05 DIAGNOSIS — K0889 Other specified disorders of teeth and supporting structures: Secondary | ICD-10-CM

## 2019-07-05 DIAGNOSIS — K047 Periapical abscess without sinus: Secondary | ICD-10-CM | POA: Insufficient documentation

## 2019-07-05 DIAGNOSIS — F17210 Nicotine dependence, cigarettes, uncomplicated: Secondary | ICD-10-CM | POA: Insufficient documentation

## 2019-07-05 LAB — COMPREHENSIVE METABOLIC PANEL
ALT: 17 U/L (ref 0–44)
AST: 19 U/L (ref 15–41)
Albumin: 3.8 g/dL (ref 3.5–5.0)
Alkaline Phosphatase: 66 U/L (ref 38–126)
Anion gap: 10 (ref 5–15)
BUN: 13 mg/dL (ref 6–20)
CO2: 23 mmol/L (ref 22–32)
Calcium: 9.2 mg/dL (ref 8.9–10.3)
Chloride: 104 mmol/L (ref 98–111)
Creatinine, Ser: 0.89 mg/dL (ref 0.44–1.00)
GFR calc Af Amer: >60 mL/min (ref >60)
GFR calc non Af Amer: >60 mL/min (ref >60)
Glucose, Bld: 110 mg/dL — ABNORMAL HIGH (ref 70–99)
Potassium: 3.7 mmol/L (ref 3.5–5.1)
Sodium: 137 mmol/L (ref 135–145)
Total Bilirubin: 0.7 mg/dL (ref 0.3–1.2)
Total Protein: 6.9 g/dL (ref 6.5–8.1)

## 2019-07-05 LAB — URINALYSIS, ROUTINE W REFLEX MICROSCOPIC
Bilirubin Urine: NEGATIVE
Glucose, UA: NEGATIVE mg/dL
Hgb urine dipstick: NEGATIVE
Ketones, ur: NEGATIVE mg/dL
Leukocytes,Ua: NEGATIVE
Nitrite: NEGATIVE
Protein, ur: NEGATIVE mg/dL
Specific Gravity, Urine: 1.019 (ref 1.005–1.030)
pH: 6 (ref 5.0–8.0)

## 2019-07-05 LAB — CBC
HCT: 40.5 % (ref 36.0–46.0)
Hemoglobin: 12.5 g/dL (ref 12.0–15.0)
MCH: 27.3 pg (ref 26.0–34.0)
MCHC: 30.9 g/dL (ref 30.0–36.0)
MCV: 88.4 fL (ref 80.0–100.0)
Platelets: 280 10*3/uL (ref 150–400)
RBC: 4.58 MIL/uL (ref 3.87–5.11)
RDW: 13.2 % (ref 11.5–15.5)
WBC: 5 10*3/uL (ref 4.0–10.5)
nRBC: 0 % (ref 0.0–0.2)

## 2019-07-05 LAB — LIPASE, BLOOD: Lipase: 26 U/L (ref 11–51)

## 2019-07-05 LAB — I-STAT BETA HCG BLOOD, ED (MC, WL, AP ONLY): I-stat hCG, quantitative: <5 m[IU]/mL (ref <5)

## 2019-07-05 MED ORDER — AMOXICILLIN-POT CLAVULANATE 875-125 MG PO TABS: 1.0000 | ORAL_TABLET | Freq: Two times a day (BID) | ORAL | 0 refills | Status: AC

## 2019-07-05 NOTE — ED Provider Notes (Signed)
[facility] EMERGENCY DEPARTMENT Provider Note   CSN: [id number] Arrival date & time: 07/05/19  1403     History Chief Complaint  Patient presents with  . Abdominal Pain  . Dental Pain    Elaine Baker is a 54 y.o. female.  54 y.o female with a PMH of GERD, HTN, Depression presents to the ED with a chief complaint of dental pain and abdominal pain x 3 days. Patient reports she has several missing teeth, more pain along the upper gum line, she has been taking tylenol for her symptoms without improvement. Patient reports upper abdominal pain, reports this occurs all the time, worse in an empty stomach.  She also endorses constipation, reports she is tried enemas, laxatives, last bowel movement was yesterday, she feels that she is not very regular with her stools.  She has had no nausea, vomiting, fevers, blood in her stool.  Patient also suffers from high blood pressure, and reports she is currently not on any medication and she has not been able to afford this.  Denies any past surgical history to her abdomen, facial swelling, dysphagia or other complaints.  The history is provided by the patient and medical records.       Past Medical History:  Diagnosis Date  . Depression with anxiety   . GERD (gastroesophageal reflux disease)   . Hypertension   . Migraine   . Sinus congestion     There are no problems to display for this patient.   Past Surgical History:  Procedure Laterality Date  . ABDOMINAL HYSTERECTOMY       OB History   No obstetric history on file.     Family History  Problem Relation Age of Onset  . Bone cancer Mother   . Hypertension Mother   . Heart disease Mother   . Kidney disease Mother   . Arthritis Mother   . Cirrhosis Father     Social History   Tobacco Use  . Smoking status: Current Every Day Smoker    Packs/day: 0.50    Types: Cigarettes  . Smokeless tobacco: Never Used  Substance Use Topics  . Alcohol use: No  .  Drug use: No    Home Medications Prior to Admission medications   Medication Sig Start Date End Date Taking? Authorizing Provider  acetaminophen (TYLENOL) 500 MG tablet Take 1,500 mg by mouth 2 (two) times daily as needed (pain).   Yes [provider]  diphenhydrAMINE-Phenylephrine (THERAFLU COLD/COUGH NIGHTTIME PO) Take 1 packet by mouth at bedtime.   Yes [provider]  Multiple Vitamin (MULTIVITAMIN WITH MINERALS) TABS tablet Take 1 tablet by mouth daily.   Yes [provider]  Tetrahydrozoline HCl (VISINE OP) Place 1 drop into both eyes daily.   Yes [provider]  amLODipine (NORVASC) 10 MG tablet Take 0.5 tablets (5 mg total) by mouth daily. Patient not taking: Reported on 07/05/2019 12/29/18   Melene Plan, DO  amoxicillin-clavulanate (AUGMENTIN) 875-125 MG tablet Take 1 tablet by mouth 2 (two) times daily for 7 days. 07/05/19 07/12/19  Claude Manges, PA-C    Allergies    Patient has no known allergies.  Review of Systems   Review of Systems  Constitutional: Negative for chills and fever.  HENT: Positive for dental problem. Negative for rhinorrhea and sinus pressure.   Respiratory: Negative for shortness of breath.   Cardiovascular: Negative for chest pain.  Gastrointestinal: Positive for abdominal pain. Negative for nausea and vomiting.    Physical  Exam Updated Vital Signs BP (!) 173/105 (BP Location: Right Arm)   Pulse 66   Temp 98.7 F (37.1 C) (Oral)   Resp 16   Ht 5\' 4"  (1.626 m)   SpO2 99%   BMI 26.00 kg/m   Physical Exam Vitals and nursing note reviewed.  Constitutional:      General: She is not in acute distress.    Appearance: She is well-developed.  HENT:     Head: Normocephalic and atraumatic.     Mouth/Throat:     Dentition: Dental caries and dental abscesses present.     Tongue: No lesions.     Palate: No mass.     Pharynx: Oropharynx is clear. Uvula midline. No oropharyngeal exudate or posterior oropharyngeal erythema.      Comments: Multiple missing teeth on the upper and lower gum line.  Eyes:     Pupils: Pupils are equal, round, and reactive to light.  Cardiovascular:     Rate and Rhythm: Regular rhythm.     Heart sounds: Normal heart sounds.  Pulmonary:     Effort: Pulmonary effort is normal. No respiratory distress.     Breath sounds: Normal breath sounds.  Abdominal:     General: Bowel sounds are normal. There is no distension.     Palpations: Abdomen is soft.     Tenderness: There is no abdominal tenderness.     Hernia: No hernia is present.     Comments: Non ttp along the abdomen.   Musculoskeletal:        General: No tenderness or deformity.     Cervical back: Normal range of motion.     Right lower leg: No edema.     Left lower leg: No edema.  Skin:    General: Skin is warm and dry.  Neurological:     Mental Status: She is alert and oriented to person, place, and time.     ED Results / Procedures / Treatments   Labs (all labs ordered are listed, but only abnormal results are displayed) Labs Reviewed  COMPREHENSIVE METABOLIC PANEL - Abnormal; Notable for the following components:      Result Value   Glucose, Bld 110 (*)    All other components within normal limits  LIPASE, BLOOD  CBC  URINALYSIS, ROUTINE W REFLEX MICROSCOPIC  I-STAT BETA HCG BLOOD, ED ([facility], [facility], [facility] ONLY)    EKG None  Radiology No results found.  Procedures Procedures (including critical care time)  Medications Ordered in ED Medications - No data to display  ED Course  I have reviewed the triage vital signs and the nursing notes.  Pertinent labs & imaging results that were available during my care of the patient were reviewed by me and considered in my medical decision making (see chart for details).    MDM Rules/Calculators/A&P   Patient with a PMH of HTN non compliant with medication presents to the ED with a chief complaint of abdominal pain  And dental pain for the past 3 days. Patient is  currently missing multiple teeth, reports she has not been able to see a dentist due to financial reasons.  She has been taking ibuprofen to help with her symptoms.  Second complaint includes abdominal pain, reports she has felt somewhat constipated, has been taking in a mild laxative which she has had some relief with her last bowel movement yesterday.  No objective signs of nausea, vomiting, fevers, blood in her stool.  Abdomen is soft, nontender to palpation.  Patient is hypertensive on arrival, does report noncompliance with medication.  UA without any signs of nitrates, leukocytes or infection.  Discussed the importance of following up with dentist.  Patient understands and agrees to management, return precautions discussed at length.  Portions of this note were generated with Scientist, clinical (histocompatibility and immunogenetics). Dictation errors may occur despite best attempts at proofreading.  Final Clinical Impression(s) / ED Diagnoses Final diagnoses:  Pain of upper abdomen  Dental abscess  Pain, dental    Rx / DC Orders ED Discharge Orders         Ordered    amoxicillin-clavulanate (AUGMENTIN) 875-125 MG tablet  2 times daily     07/05/19 1621           Claude Manges, PA-C 07/05/19 1632    Terald Sleeper, MD 07/06/19 1310

## 2019-07-05 NOTE — Discharge Instructions (Addendum)
I have prescribed medication to help with your dental infection, please take 1 tablet twice a day for the next 7 days.  Multiple dental resources are attached to your chart, please schedule an appointment in order to have dental care established.

## 2019-07-05 NOTE — ED Triage Notes (Signed)
Pt endorses upper abd pain for 2 days. Denies N/V. Also endorses dental pain and HA.

## 2019-08-05 ENCOUNTER — Emergency Department (HOSPITAL_BASED_OUTPATIENT_CLINIC_OR_DEPARTMENT_OTHER)
Admission: EM | Admit: 2019-08-05 | Discharge: 2019-08-05 | Disposition: A | Discharge status: Home / Self Care | Payer: Self-pay | Attending: Emergency Medicine | Admitting: Emergency Medicine

## 2019-08-05 ENCOUNTER — Encounter (HOSPITAL_BASED_OUTPATIENT_CLINIC_OR_DEPARTMENT_OTHER): Payer: Self-pay | Admitting: Emergency Medicine

## 2019-08-05 ENCOUNTER — Other Ambulatory Visit: Payer: Self-pay

## 2019-08-05 DIAGNOSIS — I10 Essential (primary) hypertension: Secondary | ICD-10-CM | POA: Insufficient documentation

## 2019-08-05 DIAGNOSIS — Z79899 Other long term (current) drug therapy: Secondary | ICD-10-CM | POA: Insufficient documentation

## 2019-08-05 DIAGNOSIS — R110 Nausea: Secondary | ICD-10-CM | POA: Insufficient documentation

## 2019-08-05 DIAGNOSIS — R519 Headache, unspecified: Secondary | ICD-10-CM | POA: Insufficient documentation

## 2019-08-05 DIAGNOSIS — F17210 Nicotine dependence, cigarettes, uncomplicated: Secondary | ICD-10-CM | POA: Insufficient documentation

## 2019-08-05 MED ORDER — KETOROLAC TROMETHAMINE 15 MG/ML IJ SOLN
30.0000 mg | Freq: Once | INTRAMUSCULAR | Status: AC
  Administered 2019-08-05: 30 mg via INTRAVENOUS
  Filled 2019-08-05: qty 2

## 2019-08-05 MED ORDER — AMLODIPINE BESYLATE 10 MG PO TABS: 5.0000 mg | ORAL_TABLET | Freq: Every day | ORAL | 0 refills | Status: DC

## 2019-08-05 MED ORDER — DIPHENHYDRAMINE HCL 50 MG/ML IJ SOLN
12.5000 mg | Freq: Once | INTRAMUSCULAR | Status: AC
  Administered 2019-08-05: 12.5 mg via INTRAVENOUS
  Filled 2019-08-05: qty 1

## 2019-08-05 MED ORDER — METOCLOPRAMIDE HCL 5 MG/ML IJ SOLN
10.0000 mg | Freq: Once | INTRAMUSCULAR | Status: AC
  Administered 2019-08-05: 10 mg via INTRAVENOUS
  Filled 2019-08-05: qty 2

## 2019-08-05 MED ORDER — SODIUM CHLORIDE 0.9 % IV BOLUS
1000.0000 mL | Freq: Once | INTRAVENOUS | Status: AC
  Administered 2019-08-05: 1000 mL via INTRAVENOUS

## 2019-08-05 MED ORDER — AMOXICILLIN-POT CLAVULANATE 875-125 MG PO TABS: 1.0000 | ORAL_TABLET | Freq: Two times a day (BID) | ORAL | 0 refills | Status: DC

## 2019-08-05 NOTE — ED Triage Notes (Addendum)
Headache, sinus drainage, nausea since Friday. Also has been out of BP meds for 1 month.

## 2019-08-05 NOTE — Discharge Instructions (Addendum)
You were seen in the emergency department for a headache and sinus congestion.  Your blood pressure was also elevated here.  Your symptoms improved with medication.  We are prescribing you antibiotics for your sinus infection and refilling your blood pressure medication.  It will be important for you to get a primary care doctor for further monitoring of your blood pressure.  Return to the emergency department if any worsening symptoms.

## 2019-08-05 NOTE — ED Provider Notes (Signed)
[facility] EMERGENCY DEPARTMENT Provider Note   CSN: [id number] Arrival date & time: 08/05/19  2725     History Chief Complaint  Patient presents with  . Headache    Elaine Baker is a 54 y.o. female.  She is complaining of a frontal headache that is been going on for a week.  Associated with nasal congestion sinus drainage and feeling nauseous.  She has had this before and it feels similar to her migraines and her sinus infections.  Denies any fever.  No cough or chest pain.  She also has been out of her blood pressure medicine for a month.  No numbness or weakness.  Has tried over-the-counter medications without any improvement.  Rates the pain is 8 out of 10 but is 10 out of 10 at night.  The history is provided by the patient.  Headache Pain location:  Frontal Quality:  Dull Radiates to:  Does not radiate Severity currently:  8/10 Severity at highest:  10/10 Onset quality:  Gradual Duration:  1 week Timing:  Intermittent Progression:  Unchanged Chronicity:  Recurrent Similar to prior headaches: yes   Context comment:  Sinuses Relieved by:  Nothing Worsened by:  Nothing Ineffective treatments:  NSAIDs Associated symptoms: drainage, facial pain, nausea, sinus pressure, sore throat and URI   Associated symptoms: no abdominal pain, no blurred vision, no cough, no fever, no focal weakness, no loss of balance, no neck pain, no neck stiffness and no vomiting        Past Medical History:  Diagnosis Date  . Depression with anxiety   . GERD (gastroesophageal reflux disease)   . Hypertension   . Migraine   . Sinus congestion     There are no problems to display for this patient.   Past Surgical History:  Procedure Laterality Date  . ABDOMINAL HYSTERECTOMY       OB History   No obstetric history on file.     Family History  Problem Relation Age of Onset  . Bone cancer Mother   . Hypertension Mother   . Heart disease Mother   . Kidney disease  Mother   . Arthritis Mother   . Cirrhosis Father     Social History   Tobacco Use  . Smoking status: Current Every Day Smoker    Packs/day: 0.50    Types: Cigarettes  . Smokeless tobacco: Never Used  Substance Use Topics  . Alcohol use: No  . Drug use: No    Home Medications Prior to Admission medications   Medication Sig Start Date End Date Taking? Authorizing Provider  acetaminophen (TYLENOL) 500 MG tablet Take 1,500 mg by mouth 2 (two) times daily as needed (pain).    [provider]  amLODipine (NORVASC) 10 MG tablet Take 0.5 tablets (5 mg total) by mouth daily. Patient not taking: Reported on 07/05/2019 12/29/18   Melene Plan, DO  diphenhydrAMINE-Phenylephrine (THERAFLU COLD/COUGH NIGHTTIME PO) Take 1 packet by mouth at bedtime.    [provider]  Multiple Vitamin (MULTIVITAMIN WITH MINERALS) TABS tablet Take 1 tablet by mouth daily.    [provider]  Tetrahydrozoline HCl (VISINE OP) Place 1 drop into both eyes daily.    [provider]    Allergies    Patient has no known allergies.  Review of Systems   Review of Systems  Constitutional: Negative for fever.  HENT: Positive for postnasal drip, sinus pressure, sinus pain and sore throat.   Eyes: Negative for blurred vision and  visual disturbance.  Respiratory: Negative for cough and shortness of breath.   Cardiovascular: Negative for chest pain.  Gastrointestinal: Positive for nausea. Negative for abdominal pain and vomiting.  Genitourinary: Negative for dysuria.  Musculoskeletal: Negative for neck pain and neck stiffness.  Skin: Negative for rash.  Neurological: Positive for headaches. Negative for focal weakness and loss of balance.    Physical Exam Updated Vital Signs BP (!) 179/107 (BP Location: Right Arm)   Pulse 73   Temp 97.7 F (36.5 C) (Oral)   Resp 16   Ht 5\' 4"  (1.626 m)   Wt 64.9 kg   SpO2 98%   BMI 24.55 kg/m   Physical Exam Vitals and nursing note reviewed.   Constitutional:      General: She is not in acute distress.    Appearance: She is well-developed.  HENT:     Head: Normocephalic and atraumatic.     Right Ear: Tympanic membrane normal.     Left Ear: Tympanic membrane normal.     Mouth/Throat:     Mouth: Mucous membranes are moist.     Pharynx: Oropharynx is clear.     Tonsils: No tonsillar exudate.  Eyes:     Extraocular Movements: Extraocular movements intact.     Conjunctiva/sclera: Conjunctivae normal.     Pupils: Pupils are equal, round, and reactive to light.  Cardiovascular:     Rate and Rhythm: Normal rate and regular rhythm.     Heart sounds: No murmur.  Pulmonary:     Effort: Pulmonary effort is normal. No respiratory distress.     Breath sounds: Normal breath sounds.  Abdominal:     Palpations: Abdomen is soft.     Tenderness: There is no abdominal tenderness.  Musculoskeletal:        General: No deformity or signs of injury. Normal range of motion.     Cervical back: Neck supple.  Skin:    General: Skin is warm and dry.  Neurological:     Mental Status: She is alert and oriented to person, place, and time.     GCS: GCS eye subscore is 4. GCS verbal subscore is 5. GCS motor subscore is 6.     Cranial Nerves: No cranial nerve deficit.     Sensory: No sensory deficit.     Motor: No weakness.     ED Results / Procedures / Treatments   Labs (all labs ordered are listed, but only abnormal results are displayed) Labs Reviewed - No data to display  EKG None  Radiology No results found.  Procedures Procedures (including critical care time)  Medications Ordered in ED Medications  metoCLOPramide (REGLAN) injection 10 mg (has no administration in time range)  ketorolac (TORADOL) 15 MG/ML injection 30 mg (has no administration in time range)  diphenhydrAMINE (BENADRYL) injection 12.5 mg (has no administration in time range)  sodium chloride 0.9 % bolus 1,000 mL (has no administration in time range)    ED  Course  I have reviewed the triage vital signs and the nursing notes.  Pertinent labs & imaging results that were available during my care of the patient were reviewed by me and considered in my medical decision making (see chart for details).  Clinical Course as of Aug 05 1707  Thu Aug 05, 2019  Aug 07, 2019 Differential includes migraine headache, sinus headache, tension headache, less likely bleed, hypertensive urgency   [MB]  [phone] We will treat with some symptomatic pain relief and recheck vital signs.   [MB]  1020 Headache improved.  Patient asking for refill of her amlodipine and antibiotics for her sinus infection.   [MB]    Clinical Course User Index [MB] Hayden Rasmussen, MD   MDM Rules/Calculators/A&P                      Final Clinical Impression(s) / ED Diagnoses Final diagnoses:  Sinus headache  Essential hypertension    Rx / DC Orders ED Discharge Orders         Ordered    amoxicillin-clavulanate (AUGMENTIN) 875-125 MG tablet  Every 12 hours     08/05/19 1016    amLODipine (NORVASC) 10 MG tablet  Daily     08/05/19 1016           Hayden Rasmussen, MD 08/05/19 1710

## 2019-12-21 ENCOUNTER — Encounter (HOSPITAL_BASED_OUTPATIENT_CLINIC_OR_DEPARTMENT_OTHER): Payer: Self-pay | Admitting: *Deleted

## 2019-12-21 ENCOUNTER — Other Ambulatory Visit: Payer: Self-pay

## 2019-12-21 ENCOUNTER — Emergency Department (HOSPITAL_BASED_OUTPATIENT_CLINIC_OR_DEPARTMENT_OTHER)
Admission: EM | Admit: 2019-12-21 | Discharge: 2019-12-21 | Disposition: A | Discharge status: Home / Self Care | Payer: Self-pay | Attending: Emergency Medicine | Admitting: Emergency Medicine

## 2019-12-21 DIAGNOSIS — F17210 Nicotine dependence, cigarettes, uncomplicated: Secondary | ICD-10-CM | POA: Insufficient documentation

## 2019-12-21 DIAGNOSIS — Z79899 Other long term (current) drug therapy: Secondary | ICD-10-CM | POA: Insufficient documentation

## 2019-12-21 DIAGNOSIS — R0981 Nasal congestion: Secondary | ICD-10-CM | POA: Insufficient documentation

## 2019-12-21 DIAGNOSIS — R0982 Postnasal drip: Secondary | ICD-10-CM | POA: Insufficient documentation

## 2019-12-21 DIAGNOSIS — R519 Headache, unspecified: Secondary | ICD-10-CM | POA: Insufficient documentation

## 2019-12-21 DIAGNOSIS — Z7951 Long term (current) use of inhaled steroids: Secondary | ICD-10-CM | POA: Insufficient documentation

## 2019-12-21 DIAGNOSIS — R110 Nausea: Secondary | ICD-10-CM | POA: Insufficient documentation

## 2019-12-21 DIAGNOSIS — I10 Essential (primary) hypertension: Secondary | ICD-10-CM | POA: Insufficient documentation

## 2019-12-21 DIAGNOSIS — K5900 Constipation, unspecified: Secondary | ICD-10-CM | POA: Insufficient documentation

## 2019-12-21 DIAGNOSIS — Z20822 Contact with and (suspected) exposure to covid-19: Secondary | ICD-10-CM | POA: Insufficient documentation

## 2019-12-21 DIAGNOSIS — B070 Plantar wart: Secondary | ICD-10-CM

## 2019-12-21 LAB — SARS CORONAVIRUS 2 BY RT PCR (HOSPITAL ORDER, PERFORMED IN ~~LOC~~ HOSPITAL LAB): SARS Coronavirus 2: NEGATIVE

## 2019-12-21 MED ORDER — FLUTICASONE PROPIONATE 50 MCG/ACT NA SUSP: 2.0000 | Freq: Every day | NASAL | 0 refills | Status: DC

## 2019-12-21 MED ORDER — POLYETHYLENE GLYCOL 3350 17 G PO PACK: 17.0000 g | PACK | Freq: Every day | ORAL | 0 refills | Status: AC

## 2019-12-21 MED ORDER — FAMOTIDINE 20 MG PO TABS: 20.0000 mg | ORAL_TABLET | Freq: Two times a day (BID) | ORAL | 0 refills | Status: AC

## 2019-12-21 NOTE — ED Provider Notes (Addendum)
[facility] EMERGENCY DEPARTMENT Provider Note   CSN: [id number] Arrival date & time: 12/21/19  1116     History Chief Complaint  Patient presents with  . Fatigue  . Shortness of Breath  . Headache    Elaine Baker is a 54 y.o. female.  HPI   Pt is a 54 y/o female with a h/o depression/anxiety, GERD, HTN, migraines, sinus congestion, who presents to the ED today for eval of sinus congestion, post nasal drainage, sinus pressure, headaches, fatigue that started a month ago. She also has an intermittent sore throat from the sinus drainage.  Denies fevers, cough.  She has tried taking allergy medication without relief. States she has a hx of similar sxs during this time of year due to allergies.   She also reports constipation and nausea. Her last BM was 2 days ago. She has also been having some belching and abd bloating that gets better with belching. Has h/o gerd. Denies chest pain. She denies any shortness of breath, just states that she can't breathe out of her nose because of the congestion.  She also c/o pain and "knots" to her feet. States she thinks they are warts.  Past Medical History:  Diagnosis Date  . Depression with anxiety   . GERD (gastroesophageal reflux disease)   . Hypertension   . Migraine   . Sinus congestion     There are no problems to display for this patient.   Past Surgical History:  Procedure Laterality Date  . ABDOMINAL HYSTERECTOMY       OB History   No obstetric history on file.     Family History  Problem Relation Age of Onset  . Bone cancer Mother   . Hypertension Mother   . Heart disease Mother   . Kidney disease Mother   . Arthritis Mother   . Cirrhosis Father     Social History   Tobacco Use  . Smoking status: Current Every Day Smoker    Packs/day: 0.50    Types: Cigarettes  . Smokeless tobacco: Never Used  Vaping Use  . Vaping Use: Never used  Substance Use Topics  . Alcohol use: No  . Drug use: No     Home Medications Prior to Admission medications   Medication Sig Start Date End Date Taking? Authorizing Provider  acetaminophen (TYLENOL) 500 MG tablet Take 1,500 mg by mouth 2 (two) times daily as needed (pain).    [provider]  amLODipine (NORVASC) 10 MG tablet Take 0.5 tablets (5 mg total) by mouth daily. 08/05/19   Terrilee Files, MD  amoxicillin-clavulanate (AUGMENTIN) 875-125 MG tablet Take 1 tablet by mouth every 12 (twelve) hours. 08/05/19   Terrilee Files, MD  diphenhydrAMINE-Phenylephrine (THERAFLU COLD/COUGH NIGHTTIME PO) Take 1 packet by mouth at bedtime.    [provider]  famotidine (PEPCID) 20 MG tablet Take 1 tablet (20 mg total) by mouth 2 (two) times daily. 12/21/19   Melonee Gerstel S, PA-C  fluticasone (FLONASE) 50 MCG/ACT nasal spray Place 2 sprays into both nostrils daily. 12/21/19   Kerensa Nicklas S, PA-C  Multiple Vitamin (MULTIVITAMIN WITH MINERALS) TABS tablet Take 1 tablet by mouth daily.    [provider]  polyethylene glycol (MIRALAX) 17 g packet Take 17 g by mouth daily. Dissolve one cap full in solution (water, gatorade, etc.) and administer once cap-full daily. You may titrate up daily by 1 cap-full until the patient is having pudding consistency of stools. After the patient is  able to start passing softer stools they will need to be on 1/2 cap-full daily for 2 weeks. 12/21/19   Soul Deveney S, PA-C  Tetrahydrozoline HCl (VISINE OP) Place 1 drop into both eyes daily.    [provider]    Allergies    Patient has no known allergies.  Review of Systems   Review of Systems  Constitutional: Negative for chills and fever.  HENT: Positive for congestion, postnasal drip, rhinorrhea, sinus pressure and sore throat. Negative for ear pain.   Eyes: Negative for pain and visual disturbance.  Respiratory: Negative for cough and shortness of breath.   Cardiovascular: Negative for chest pain.  Gastrointestinal: Positive  for constipation and nausea. Negative for diarrhea and vomiting.  Genitourinary: Negative for dysuria and hematuria.  Musculoskeletal: Negative for back pain.  Skin: Negative for rash.  Neurological: Positive for headaches. Negative for dizziness and light-headedness.  All other systems reviewed and are negative.   Physical Exam Updated Vital Signs BP (!) 138/79 (BP Location: Right Arm)   Pulse 55   Temp 98 F (36.7 C) (Oral)   Resp 16   Ht 5\' 4"  (1.626 m)   Wt 64.9 kg   SpO2 97%   BMI 24.55 kg/m   Physical Exam Vitals and nursing note reviewed.  Constitutional:      General: She is not in acute distress.    Appearance: She is well-developed.  HENT:     Head: Normocephalic and atraumatic.     Nose:     Comments: Nasal turbinates swollen bilaterally    Mouth/Throat:     Mouth: Mucous membranes are moist.  Eyes:     Conjunctiva/sclera: Conjunctivae normal.  Cardiovascular:     Rate and Rhythm: Normal rate and regular rhythm.     Heart sounds: No murmur heard.   Pulmonary:     Effort: Pulmonary effort is normal. No respiratory distress.     Breath sounds: Normal breath sounds. No decreased breath sounds, wheezing, rhonchi or rales.  Abdominal:     General: Bowel sounds are normal.     Palpations: Abdomen is soft.     Tenderness: There is no abdominal tenderness. There is no guarding or rebound.  Musculoskeletal:     Cervical back: Neck supple.  Skin:    General: Skin is warm and dry.     Comments: Plantar warts noted to bilat feet  Neurological:     Mental Status: She is alert.     ED Results / Procedures / Treatments   Labs (all labs ordered are listed, but only abnormal results are displayed) Labs Reviewed  SARS CORONAVIRUS 2 BY RT PCR (HOSPITAL ORDER, PERFORMED IN [facility])    EKG None  Radiology No results found.  Procedures Procedures (including critical care time)  Medications Ordered in ED Medications - No data to  display  ED Course  I have reviewed the triage vital signs and the nursing notes.  Pertinent labs & imaging results that were available during my care of the patient were reviewed by me and considered in my medical decision making (see chart for details).    MDM Rules/Calculators/A&P                          54 year old female here with multiple complaints including URI symptoms/allergy symptoms, also some constipation symptoms consistent with acid reflux.  Her exam is very reassuring, vital signs are normal.  Nontoxic nonseptic appearing.  I suspect that her upper respiratory symptoms are related to allergies however have ordered Covid test and it is pending at the time of discharge.  She is on allergy medication at home, I will prescribe Flonase for her nasal congestion.  Advised continuation of her allergy medications.  Further advised that I will give Rx for MiraLAX for her constipation and Pepcid for her acid reflux. She can buy OTC meds for her plantar warts. Have advised close follow-up with PCP and strict return precautions.  She voiced understanding of the plan and reasons to return.  Questions answered.  Patient stable for discharge.  Elaine Baker was evaluated in Emergency Department on 12/21/2019 for the symptoms described in the history of present illness. She was evaluated in the context of the global COVID-19 pandemic, which necessitated consideration that the patient might be at risk for infection with the SARS-CoV-2 virus that causes COVID-19. Institutional protocols and algorithms that pertain to the evaluation of patients at risk for COVID-19 are in a state of rapid change based on information released by regulatory bodies including the CDC and federal and state organizations. These policies and algorithms were followed during the patient's care in the ED.  Final Clinical Impression(s) / ED Diagnoses Final diagnoses:  Nasal congestion  Constipation, unspecified constipation  type    Rx / DC Orders ED Discharge Orders         Ordered    fluticasone (FLONASE) 50 MCG/ACT nasal spray  Daily     Discontinue  Reprint     12/21/19 1300    polyethylene glycol (MIRALAX) 17 g packet  Daily     Discontinue  Reprint     12/21/19 1300    famotidine (PEPCID) 20 MG tablet  2 times daily     Discontinue  Reprint     12/21/19 1300           Isabela Nardelli S, PA-C 12/21/19 1301    Karrie Meres, PA-C 12/21/19 1402    Terald Sleeper, MD 12/21/19 1732

## 2019-12-21 NOTE — Discharge Instructions (Signed)
Use two sprays of the fluticasone nasal spray to help with your congestion. Continue your over the counter allergy medication.  You were also given a prescription for MiraLAX to help with your constipation and Pepcid to help with your nausea and belching as this is likely related to acid reflux symptoms.  The painful areas on your feet are likely related to plantar warts. You can buy over the counter wart remover treatment and use this to help with your symptoms.  Please follow up with your primary care provider within 5-7 days for re-evaluation of your symptoms. If you do not have a primary care provider, information for a healthcare clinic has been provided for you to make arrangements for follow up care. Please return to the emergency department for any new or worsening symptoms.

## 2019-12-21 NOTE — ED Triage Notes (Signed)
Fatigue, headaches, sinus drainage, sob x 3 days. She has a knots on the bottoms of her feet.

## 2019-12-21 NOTE — ED Notes (Signed)
Pt discharged to home. Discharge instructions have been discussed with patient and/or family members. Pt verbally acknowledges understanding d/c instructions, and endorses comprehension to checkout at registration before leaving.  °

## 2020-02-07 ENCOUNTER — Emergency Department (HOSPITAL_BASED_OUTPATIENT_CLINIC_OR_DEPARTMENT_OTHER)
Admission: EM | Admit: 2020-02-07 | Discharge: 2020-02-07 | Disposition: A | Discharge status: Home / Self Care | Payer: Self-pay | Attending: Emergency Medicine | Admitting: Emergency Medicine

## 2020-02-07 ENCOUNTER — Encounter (HOSPITAL_BASED_OUTPATIENT_CLINIC_OR_DEPARTMENT_OTHER): Payer: Self-pay

## 2020-02-07 ENCOUNTER — Other Ambulatory Visit: Payer: Self-pay

## 2020-02-07 DIAGNOSIS — Z20822 Contact with and (suspected) exposure to covid-19: Secondary | ICD-10-CM | POA: Insufficient documentation

## 2020-02-07 DIAGNOSIS — Z5321 Procedure and treatment not carried out due to patient leaving prior to being seen by health care provider: Secondary | ICD-10-CM | POA: Insufficient documentation

## 2020-02-07 DIAGNOSIS — H9201 Otalgia, right ear: Secondary | ICD-10-CM | POA: Insufficient documentation

## 2020-02-07 DIAGNOSIS — J329 Chronic sinusitis, unspecified: Secondary | ICD-10-CM | POA: Insufficient documentation

## 2020-02-07 LAB — SARS CORONAVIRUS 2 BY RT PCR (HOSPITAL ORDER, PERFORMED IN ~~LOC~~ HOSPITAL LAB): SARS Coronavirus 2: NEGATIVE

## 2020-02-07 NOTE — ED Triage Notes (Addendum)
Pt arrives with c/o left ear pain for a month and sinus pain X2 months.   Pt also states that she would like to be tested for Covid reports a family member that is positive came to her house. Denies any new symptoms.

## 2020-02-08 ENCOUNTER — Telehealth (HOSPITAL_COMMUNITY): Payer: Self-pay

## 2020-02-08 ENCOUNTER — Telehealth: Payer: Self-pay | Admitting: General Practice

## 2020-02-08 NOTE — Telephone Encounter (Signed)
Negative COVID results given. Patient results "NOT Detected." Caller expressed understanding. ° °

## 2020-08-14 ENCOUNTER — Emergency Department (HOSPITAL_BASED_OUTPATIENT_CLINIC_OR_DEPARTMENT_OTHER)
Admission: EM | Admit: 2020-08-14 | Discharge: 2020-08-14 | Disposition: A | Discharge status: Home / Self Care | Payer: Self-pay | Attending: Emergency Medicine | Admitting: Emergency Medicine

## 2020-08-14 ENCOUNTER — Other Ambulatory Visit: Payer: Self-pay

## 2020-08-14 ENCOUNTER — Encounter (HOSPITAL_BASED_OUTPATIENT_CLINIC_OR_DEPARTMENT_OTHER): Payer: Self-pay | Admitting: *Deleted

## 2020-08-14 DIAGNOSIS — F17210 Nicotine dependence, cigarettes, uncomplicated: Secondary | ICD-10-CM | POA: Insufficient documentation

## 2020-08-14 DIAGNOSIS — B070 Plantar wart: Secondary | ICD-10-CM | POA: Insufficient documentation

## 2020-08-14 DIAGNOSIS — I10 Essential (primary) hypertension: Secondary | ICD-10-CM | POA: Insufficient documentation

## 2020-08-14 DIAGNOSIS — Z79899 Other long term (current) drug therapy: Secondary | ICD-10-CM | POA: Insufficient documentation

## 2020-08-14 DIAGNOSIS — M7918 Myalgia, other site: Secondary | ICD-10-CM

## 2020-08-14 DIAGNOSIS — M542 Cervicalgia: Secondary | ICD-10-CM | POA: Insufficient documentation

## 2020-08-14 MED ORDER — CYCLOBENZAPRINE HCL 10 MG PO TABS: 10.0000 mg | ORAL_TABLET | Freq: Two times a day (BID) | ORAL | 0 refills | Status: AC | PRN

## 2020-08-14 NOTE — ED Provider Notes (Signed)
[facility] EMERGENCY DEPARTMENT Provider Note   CSN: [id number] Arrival date & time: 08/14/20  1238     History Chief Complaint  Patient presents with  . Neck Pain    Elaine Baker is a 55 y.o. female.  55 year old female presents with multiple complaints.  Patient states that she has calluses to the soles of both of her feet that she pulls off however they are painful despite trying to wear cushioned shoes.  This problem is ongoing for several months.  Also reports a tightening pain around her right scapula ever since having Covid several weeks ago.  Patient states that the pain goes from her shoulder down to her right foot and feels like her right side of her body goes numb at times.  Denies any falls or injuries.  No other complaints or concerns.        Past Medical History:  Diagnosis Date  . Depression with anxiety   . GERD (gastroesophageal reflux disease)   . Hypertension   . Migraine   . Sinus congestion     There are no problems to display for this patient.   Past Surgical History:  Procedure Laterality Date  . ABDOMINAL HYSTERECTOMY       OB History   No obstetric history on file.     Family History  Problem Relation Age of Onset  . Bone cancer Mother   . Hypertension Mother   . Heart disease Mother   . Kidney disease Mother   . Arthritis Mother   . Cirrhosis Father     Social History   Tobacco Use  . Smoking status: Current Every Day Smoker    Packs/day: 0.50    Types: Cigarettes  . Smokeless tobacco: Never Used  Vaping Use  . Vaping Use: Never used  Substance Use Topics  . Alcohol use: No  . Drug use: No    Home Medications Prior to Admission medications   Medication Sig Start Date End Date Taking? Authorizing Provider  acetaminophen (TYLENOL) 500 MG tablet Take 1,500 mg by mouth 2 (two) times daily as needed (pain).   Yes [provider]  amLODipine (NORVASC) 10 MG tablet Take 0.5 tablets (5 mg total) by  mouth daily. 08/05/19  Yes Terrilee Files, MD  cyclobenzaprine (FLEXERIL) 10 MG tablet Take 1 tablet (10 mg total) by mouth 2 (two) times daily as needed for muscle spasms. 08/14/20  Yes Jeannie Fend, PA-C  amoxicillin-clavulanate (AUGMENTIN) 875-125 MG tablet Take 1 tablet by mouth every 12 (twelve) hours. 08/05/19   Terrilee Files, MD  diphenhydrAMINE-Phenylephrine (THERAFLU COLD/COUGH NIGHTTIME PO) Take 1 packet by mouth at bedtime.    [provider]  famotidine (PEPCID) 20 MG tablet Take 1 tablet (20 mg total) by mouth 2 (two) times daily. 12/21/19   Couture, Cortni S, PA-C  fluticasone (FLONASE) 50 MCG/ACT nasal spray Place 2 sprays into both nostrils daily. 12/21/19   Couture, Cortni S, PA-C  Multiple Vitamin (MULTIVITAMIN WITH MINERALS) TABS tablet Take 1 tablet by mouth daily.    [provider]  polyethylene glycol (MIRALAX) 17 g packet Take 17 g by mouth daily. Dissolve one cap full in solution (water, gatorade, etc.) and administer once cap-full daily. You may titrate up daily by 1 cap-full until the patient is having pudding consistency of stools. After the patient is able to start passing softer stools they will need to be on 1/2 cap-full daily for 2 weeks. 12/21/19   Couture, Cortni S,  PA-C  Tetrahydrozoline HCl (VISINE OP) Place 1 drop into both eyes daily.    [provider]    Allergies    Patient has no known allergies.  Review of Systems   Review of Systems  Constitutional: Negative for fever.  Cardiovascular: Negative for chest pain.  Gastrointestinal: Negative for abdominal pain.  Musculoskeletal: Positive for arthralgias and myalgias.  Skin: Negative for color change, rash and wound.  Allergic/Immunologic: Negative for immunocompromised state.  Neurological: Positive for numbness. Negative for weakness.  Psychiatric/Behavioral: Negative for confusion.  All other systems reviewed and are negative.   Physical Exam Updated Vital Signs BP  138/84 (BP Location: Left Arm)   Pulse 66   Temp 98.1 F (36.7 C) (Oral)   Resp 14   Ht 5\' 4"  (1.626 m)   Wt 66 kg   SpO2 100%   BMI 24.98 kg/m   Physical Exam Vitals and nursing note reviewed.  Constitutional:      General: She is not in acute distress.    Appearance: She is well-developed. She is not diaphoretic.  HENT:     Head: Normocephalic and atraumatic.  Cardiovascular:     Pulses: Normal pulses.  Pulmonary:     Effort: Pulmonary effort is normal.  Musculoskeletal:        General: No swelling, tenderness, deformity or signs of injury.       Arms:     Cervical back: Normal range of motion and neck supple. No tenderness.     Right lower leg: No edema.     Left lower leg: No edema.     Comments: Pain reproduced with palpation along right trapezius area extending to medial border of right scapula.  There is no midline or bony tenderness.  Strong radial pulses present with equal grip strength and sensation intact. DP pulses in feet present, sensation intact, normal gait.  Patient does have 2 plantars warts to the bottom of the left foot and 1 to the bottom of the right foot.  Skin:    General: Skin is warm and dry.     Findings: Rash present. No erythema.  Neurological:     Mental Status: She is alert and oriented to person, place, and time.     Sensory: No sensory deficit.     Motor: No weakness.     Gait: Gait normal.  Psychiatric:        Behavior: Behavior normal.     ED Results / Procedures / Treatments   Labs (all labs ordered are listed, but only abnormal results are displayed) Labs Reviewed - No data to display  EKG None  Radiology No results found.  Procedures Procedures   Medications Ordered in ED Medications - No data to display  ED Course  I have reviewed the triage vital signs and the nursing notes.  Pertinent labs & imaging results that were available during my care of the patient were reviewed by me and considered in my medical decision  making (see chart for details).  Clinical Course as of 08/14/20 1414  Mon Aug 14, 2020  3474 55 year old female with complaint of plantars warts to both feet, appear noninfected, uncomplicated, given OTC management at home with podiatry referral if needed. Also reports pain in her right AC area which extends right side of her body at times without traumatic injury.  No midline or bony tenderness, pulses present in all extremities with sensation intact and normal gait.  Given prescription for Flexeril, advised to also take  Motrin as needed as directed and sports medicine if pain continues. [LM]    Clinical Course User Index [LM] Alden Hipp   MDM Rules/Calculators/A&P                         Final Clinical Impression(s) / ED Diagnoses Final diagnoses:  Musculoskeletal pain  Plantar wart of both feet    Rx / DC Orders ED Discharge Orders         Ordered    cyclobenzaprine (FLEXERIL) 10 MG tablet  2 times daily PRN        08/14/20 1412           Jeannie Fend, PA-C 08/14/20 1414    Terald Sleeper, MD 08/14/20 1759

## 2020-08-14 NOTE — Discharge Instructions (Signed)
Follow-up instructions for management of your plantars warts.  Follow-up with podiatry if not improving.  Take Motrin as needed as directed.  Take Flexeril as needed as prescribed for muscle pain on the right side of your body.  Follow-up with sports medicine if not improving, referral given.

## 2020-08-14 NOTE — ED Triage Notes (Signed)
When she bends forward the right side of her body gets numb. Symptoms resolve when she keeps her head straight. Symptoms x 6 weeks. Back pain.  Her feet are painful.

## 2020-09-16 ENCOUNTER — Other Ambulatory Visit: Payer: Self-pay

## 2020-09-16 ENCOUNTER — Emergency Department (HOSPITAL_BASED_OUTPATIENT_CLINIC_OR_DEPARTMENT_OTHER)
Admission: EM | Admit: 2020-09-16 | Discharge: 2020-09-16 | Disposition: A | Discharge status: Home / Self Care | Payer: Self-pay | Attending: Emergency Medicine | Admitting: Emergency Medicine

## 2020-09-16 ENCOUNTER — Encounter (HOSPITAL_BASED_OUTPATIENT_CLINIC_OR_DEPARTMENT_OTHER): Payer: Self-pay | Admitting: Emergency Medicine

## 2020-09-16 DIAGNOSIS — R519 Headache, unspecified: Secondary | ICD-10-CM | POA: Insufficient documentation

## 2020-09-16 DIAGNOSIS — Z79899 Other long term (current) drug therapy: Secondary | ICD-10-CM | POA: Insufficient documentation

## 2020-09-16 DIAGNOSIS — J302 Other seasonal allergic rhinitis: Secondary | ICD-10-CM | POA: Insufficient documentation

## 2020-09-16 DIAGNOSIS — H1031 Unspecified acute conjunctivitis, right eye: Secondary | ICD-10-CM | POA: Insufficient documentation

## 2020-09-16 DIAGNOSIS — F17210 Nicotine dependence, cigarettes, uncomplicated: Secondary | ICD-10-CM | POA: Insufficient documentation

## 2020-09-16 DIAGNOSIS — I10 Essential (primary) hypertension: Secondary | ICD-10-CM | POA: Insufficient documentation

## 2020-09-16 MED ORDER — OLOPATADINE HCL 0.1 % OP SOLN: 1.0000 [drp] | Freq: Two times a day (BID) | OPHTHALMIC | 12 refills | Status: AC

## 2020-09-16 MED ORDER — POLYMYXIN B-TRIMETHOPRIM 10000-0.1 UNIT/ML-% OP SOLN
1.0000 [drp] | OPHTHALMIC | Status: DC
  Administered 2020-09-16: 1 [drp] via OPHTHALMIC
  Filled 2020-09-16: qty 10

## 2020-09-16 MED ORDER — CETIRIZINE HCL 10 MG PO TABS: 10.0000 mg | ORAL_TABLET | Freq: Every day | ORAL | 1 refills | Status: DC

## 2020-09-16 NOTE — Discharge Instructions (Addendum)
You can use the eye drops you got at the ER every 4 hours while you are awake for the next 5 days.  The other eyedrops you can use for allergy type symptoms.  Just do not use the eyedrops directly together.  Another dose of allergy medication was sent to your pharmacy at [facility].  You can continue to use the nasal spray.

## 2020-09-16 NOTE — ED Triage Notes (Signed)
Pt reports possible pink eye onset yesterday. Pt reports grandchildren have pink eye as well.

## 2020-09-16 NOTE — ED Provider Notes (Signed)
[facility] EMERGENCY DEPARTMENT Provider Note   CSN: [id number] Arrival date & time: 09/16/20  1102     History Chief Complaint  Patient presents with  . Eye Problem    Elaine Baker is a 55 y.o. female.  The history is provided by the patient.  Eye Problem Location:  Right eye Quality:  Burning (Itching) Severity:  Moderate Onset quality:  Gradual Duration:  1 day Timing:  Constant Progression:  Worsening Chronicity:  New Context comment:  Grandchildren both currently being treated for pinkeye Relieved by:  None tried Exacerbated by: She has been rubbing it continuously but it is not helping. Ineffective treatments:  None tried Associated symptoms: headaches, inflammation, itching and redness   Associated symptoms: no blurred vision, no crusting, no decreased vision, no discharge, no facial rash, no photophobia, no swelling and no tearing   Associated symptoms comment:  Nasal congestion and increased allergy symptoms.  Sometimes a headache over the forehead.  She used to take allergy medicine but did not feel like it helped so she discontinued.  She does have nasal spray which she uses occasionally.      Past Medical History:  Diagnosis Date  . Depression with anxiety   . GERD (gastroesophageal reflux disease)   . Hypertension   . Migraine   . Sinus congestion     There are no problems to display for this patient.   Past Surgical History:  Procedure Laterality Date  . ABDOMINAL HYSTERECTOMY       OB History   No obstetric history on file.     Family History  Problem Relation Age of Onset  . Bone cancer Mother   . Hypertension Mother   . Heart disease Mother   . Kidney disease Mother   . Arthritis Mother   . Cirrhosis Father     Social History   Tobacco Use  . Smoking status: Current Every Day Smoker    Packs/day: 0.50    Types: Cigarettes  . Smokeless tobacco: Never Used  Vaping Use  . Vaping Use: Never used  Substance Use  Topics  . Alcohol use: No  . Drug use: No    Home Medications Prior to Admission medications   Medication Sig Start Date End Date Taking? Authorizing Provider  acetaminophen (TYLENOL) 500 MG tablet Take 1,500 mg by mouth 2 (two) times daily as needed (pain).    [provider]  amLODipine (NORVASC) 10 MG tablet Take 0.5 tablets (5 mg total) by mouth daily. 08/05/19   Terrilee Files, MD  amoxicillin-clavulanate (AUGMENTIN) 875-125 MG tablet Take 1 tablet by mouth every 12 (twelve) hours. 08/05/19   Terrilee Files, MD  cyclobenzaprine (FLEXERIL) 10 MG tablet Take 1 tablet (10 mg total) by mouth 2 (two) times daily as needed for muscle spasms. 08/14/20   Jeannie Fend, PA-C  diphenhydrAMINE-Phenylephrine (THERAFLU COLD/COUGH NIGHTTIME PO) Take 1 packet by mouth at bedtime.    [provider]  famotidine (PEPCID) 20 MG tablet Take 1 tablet (20 mg total) by mouth 2 (two) times daily. 12/21/19   Couture, Cortni S, PA-C  fluticasone (FLONASE) 50 MCG/ACT nasal spray Place 2 sprays into both nostrils daily. 12/21/19   Couture, Cortni S, PA-C  Multiple Vitamin (MULTIVITAMIN WITH MINERALS) TABS tablet Take 1 tablet by mouth daily.    [provider]  polyethylene glycol (MIRALAX) 17 g packet Take 17 g by mouth daily. Dissolve one cap full in solution (water, gatorade, etc.) and administer once cap-full  daily. You may titrate up daily by 1 cap-full until the patient is having pudding consistency of stools. After the patient is able to start passing softer stools they will need to be on 1/2 cap-full daily for 2 weeks. 12/21/19   Couture, Cortni S, PA-C  Tetrahydrozoline HCl (VISINE OP) Place 1 drop into both eyes daily.    [provider]    Allergies    Patient has no known allergies.  Review of Systems   Review of Systems  Eyes: Positive for redness and itching. Negative for blurred vision, photophobia and discharge.  Neurological: Positive for headaches.  All  other systems reviewed and are negative.   Physical Exam Updated Vital Signs BP (!) 138/92 (BP Location: Left Arm)   Pulse 65   Temp 98.5 F (36.9 C) (Oral)   Resp 18   Ht 5\' 4"  (1.626 m)   Wt 66.7 kg   SpO2 98%   BMI 25.23 kg/m   Physical Exam Vitals and nursing note reviewed.  Constitutional:      General: She is not in acute distress.    Appearance: Normal appearance. She is well-developed.  HENT:     Head: Normocephalic and atraumatic.     Right Ear: Tympanic membrane normal.     Left Ear: Tympanic membrane normal.     Nose: Mucosal edema and congestion present.  Eyes:     General: Lids are normal.     Conjunctiva/sclera:     Right eye: Right conjunctiva is injected. Hemorrhage present. No chemosis or exudate.    Pupils: Pupils are equal, round, and reactive to light.      Comments: Bilateral vision is 2020  Cardiovascular:     Rate and Rhythm: Normal rate.  Pulmonary:     Effort: Pulmonary effort is normal. No respiratory distress.  Abdominal:     Tenderness: There is no guarding.  Musculoskeletal:        General: No tenderness. Normal range of motion.     Comments: No edema  Skin:    General: Skin is warm and dry.     Findings: No rash.  Neurological:     Mental Status: She is alert and oriented to person, place, and time.     Cranial Nerves: No cranial nerve deficit.  Psychiatric:        Behavior: Behavior normal.     ED Results / Procedures / Treatments   Labs (all labs ordered are listed, but only abnormal results are displayed) Labs Reviewed - No data to display  EKG None  Radiology No results found.  Procedures Procedures   Medications Ordered in ED Medications  trimethoprim-polymyxin b (POLYTRIM) ophthalmic solution 1 drop (has no administration in time range)    ED Course  I have reviewed the triage vital signs and the nursing notes.  Pertinent labs & imaging results that were available during my care of the patient were reviewed  by me and considered in my medical decision making (see chart for details).    MDM Rules/Calculators/A&P                          Patient presenting today with itching, burning of the right eye and known exposure to bacterial conjunctivitis from her grandchildren.  Symptoms started yesterday.  She has been rubbing the eye continually and now has a small conjunctival hemorrhage.  Her vision is intact.  She is also complaining of sinus symptoms and frontal headache.  She has a history of allergies but does not take any allergy medication at this time.  Patient given Polytrim drops here.  Also given Pataday and cetirizine to go home with.   Final Clinical Impression(s) / ED Diagnoses Final diagnoses:  Acute conjunctivitis of right eye, unspecified acute conjunctivitis type  Seasonal allergies    Rx / DC Orders ED Discharge Orders         Ordered    cetirizine (ZYRTEC) 10 MG tablet  Daily        09/16/20 1156    olopatadine (PATADAY) 0.1 % ophthalmic solution  2 times daily        09/16/20 1156           Gwyneth Sprout, MD 09/16/20 1157

## 2021-08-13 ENCOUNTER — Emergency Department (HOSPITAL_BASED_OUTPATIENT_CLINIC_OR_DEPARTMENT_OTHER)
Admission: EM | Admit: 2021-08-13 | Discharge: 2021-08-13 | Disposition: A | Discharge status: Home / Self Care | Payer: Self-pay | Attending: Emergency Medicine | Admitting: Emergency Medicine

## 2021-08-13 ENCOUNTER — Encounter (HOSPITAL_BASED_OUTPATIENT_CLINIC_OR_DEPARTMENT_OTHER): Payer: Self-pay | Admitting: *Deleted

## 2021-08-13 ENCOUNTER — Other Ambulatory Visit: Payer: Self-pay

## 2021-08-13 DIAGNOSIS — I10 Essential (primary) hypertension: Secondary | ICD-10-CM | POA: Insufficient documentation

## 2021-08-13 DIAGNOSIS — Z79899 Other long term (current) drug therapy: Secondary | ICD-10-CM | POA: Insufficient documentation

## 2021-08-13 DIAGNOSIS — J019 Acute sinusitis, unspecified: Secondary | ICD-10-CM | POA: Insufficient documentation

## 2021-08-13 MED ORDER — FLUTICASONE PROPIONATE 50 MCG/ACT NA SUSP: 2.0000 | Freq: Every day | NASAL | 0 refills | Status: AC

## 2021-08-13 MED ORDER — CETIRIZINE HCL 10 MG PO TABS: 10.0000 mg | ORAL_TABLET | Freq: Every day | ORAL | 1 refills | Status: AC

## 2021-08-13 MED ORDER — DEXAMETHASONE 4 MG PO TABS
10.0000 mg | ORAL_TABLET | Freq: Once | ORAL | Status: AC
  Administered 2021-08-13: 10 mg via ORAL
  Filled 2021-08-13: qty 3

## 2021-08-13 MED ORDER — AMLODIPINE BESYLATE 10 MG PO TABS: 10.0000 mg | ORAL_TABLET | Freq: Every day | ORAL | 0 refills | Status: DC

## 2021-08-13 MED ORDER — AMOXICILLIN-POT CLAVULANATE 875-125 MG PO TABS: 1.0000 | ORAL_TABLET | Freq: Two times a day (BID) | ORAL | 0 refills | Status: DC

## 2021-08-13 NOTE — ED Triage Notes (Signed)
Sinus drainage. 

## 2021-08-13 NOTE — ED Provider Notes (Signed)
?[facility] EMERGENCY DEPARTMENT ?Provider Note ? ? ?CSN: [id number] ?Arrival date & time: 08/13/21  1616 ? ?  ? ?History ? ?Chief Complaint  ?Patient presents with  ? Facial Pain  ? ? ?Elaine Baker is a 56 y.o. female. ? ?Patient was sinus pain for the last several days.  Suspects allergies.  History of the same.  Denies any fevers or chills.  Denies any sore throat or drooling.  No concern for COVID.  Nothing has made it better or worse.  Has tried multiple over-the-counter medications without much relief.  Denies any neck pain, chest pain, shortness of breath.  History of hypertension.  Asking for refill of her amlodipine. ? ?The history is provided by the patient.  ? ?  ? ?Home Medications ?Prior to Admission medications   ?Medication Sig Start Date End Date Taking? Authorizing Provider  ?acetaminophen (TYLENOL) 500 MG tablet Take 1,500 mg by mouth 2 (two) times daily as needed (pain).    [provider]  ?amLODipine (NORVASC) 10 MG tablet Take 1 tablet (10 mg total) by mouth daily. 08/13/21   Virgina Norfolk, DO  ?amoxicillin-clavulanate (AUGMENTIN) 875-125 MG tablet Take 1 tablet by mouth every 12 (twelve) hours. 08/13/21   Yasmene Salomone, DO  ?cetirizine (ZYRTEC) 10 MG tablet Take 1 tablet (10 mg total) by mouth daily. 08/13/21   Kolbi Altadonna, DO  ?cyclobenzaprine (FLEXERIL) 10 MG tablet Take 1 tablet (10 mg total) by mouth 2 (two) times daily as needed for muscle spasms. 08/14/20   Jeannie Fend, PA-C  ?diphenhydrAMINE-Phenylephrine (THERAFLU COLD/COUGH NIGHTTIME PO) Take 1 packet by mouth at bedtime.    [provider]  ?famotidine (PEPCID) 20 MG tablet Take 1 tablet (20 mg total) by mouth 2 (two) times daily. 12/21/19   Couture, Cortni S, PA-C  ?fluticasone (FLONASE) 50 MCG/ACT nasal spray Place 2 sprays into both nostrils daily. 08/13/21   Virgina Norfolk, DO  ?Multiple Vitamin (MULTIVITAMIN WITH MINERALS) TABS tablet Take 1 tablet by mouth daily.    [provider]   ?olopatadine (PATADAY) 0.1 % ophthalmic solution Place 1 drop into both eyes 2 (two) times daily. 09/16/20   Gwyneth Sprout, MD  ?polyethylene glycol (MIRALAX) 17 g packet Take 17 g by mouth daily. Dissolve one cap full in solution (water, gatorade, etc.) and administer once cap-full daily. You may titrate up daily by 1 cap-full until the patient is having pudding consistency of stools. After the patient is able to start passing softer stools they will need to be on 1/2 cap-full daily for 2 weeks. 12/21/19   Couture, Cortni S, PA-C  ?Tetrahydrozoline HCl (VISINE OP) Place 1 drop into both eyes daily.    [provider]  ?   ? ?Allergies    ?Patient has no known allergies.   ? ?Review of Systems   ?Review of Systems ? ?Physical Exam ?Updated Vital Signs ?BP (!) 171/106 (BP Location: Right Arm)   Pulse 63   Temp 98.5 ?F (36.9 ?C) (Oral)   Resp 14   Ht 5\' 4"  (1.626 m)   Wt 68 kg   SpO2 95%   BMI 25.75 kg/m?  ?Physical Exam ?Vitals and nursing note reviewed.  ?Constitutional:   ?   General: She is not in acute distress. ?   Appearance: She is well-developed.  ?HENT:  ?   Head: Normocephalic and atraumatic.  ?   Nose: Congestion present.  ?   Mouth/Throat:  ?   Mouth: Mucous membranes are moist.  ?  Eyes:  ?   Extraocular Movements: Extraocular movements intact.  ?   Conjunctiva/sclera: Conjunctivae normal.  ?   Pupils: Pupils are equal, round, and reactive to light.  ?Cardiovascular:  ?   Rate and Rhythm: Normal rate and regular rhythm.  ?   Heart sounds: No murmur heard. ?Pulmonary:  ?   Effort: Pulmonary effort is normal. No respiratory distress.  ?   Breath sounds: Normal breath sounds.  ?Abdominal:  ?   Palpations: Abdomen is soft.  ?   Tenderness: There is no abdominal tenderness.  ?Musculoskeletal:     ?   General: No swelling.  ?   Cervical back: Neck supple.  ?Skin: ?   General: Skin is warm and dry.  ?   Capillary Refill: Capillary refill takes less than 2 seconds.  ?Neurological:  ?   Mental  Status: She is alert.  ?Psychiatric:     ?   Mood and Affect: Mood normal.  ? ? ?ED Results / Procedures / Treatments   ?Labs ?(all labs ordered are listed, but only abnormal results are displayed) ?Labs Reviewed - No data to display ? ?EKG ?None ? ?Radiology ?No results found. ? ?Procedures ?Procedures  ? ? ?Medications Ordered in ED ?Medications  ?dexamethasone (DECADRON) tablet 10 mg (10 mg Oral Given 08/13/21 1718)  ? ? ?ED Course/ Medical Decision Making/ A&P ?  ?                        ?Medical Decision Making ?Risk ?OTC drugs. ?Prescription drug management. ? ? ?Ivy Lee-Beaston is here with sinus congestion.  History of allergies.  Also history of high blood pressure.  Asking for amlodipine refill.  No stroke symptoms or chest pain.  Sinus pain for the last several days with use of over the counter medications without much relief.  Overall patient appears to have sinusitis on exam.  Likely viral or allergic in origin.  Denies COVID.  No neck pain, shortness of breath, trismus, drooling, sore throat.  No concern for pharyngitis.  We will start her on Flonase, Claritin, Decadron, Augmentin.  Discharged in good condition.  Understands return precautions. ? ?This chart was dictated using voice recognition software.  Despite best efforts to proofread,  errors can occur which can change the documentation meaning.  ? ? ? ? ? ? ? ?Final Clinical Impression(s) / ED Diagnoses ?Final diagnoses:  ?Acute sinusitis, recurrence not specified, unspecified location  ? ? ?Rx / DC Orders ?ED Discharge Orders   ? ?      Ordered  ?  amLODipine (NORVASC) 10 MG tablet  Daily       ? 08/13/21 1723  ?  amoxicillin-clavulanate (AUGMENTIN) 875-125 MG tablet  Every 12 hours       ? 08/13/21 1723  ?  cetirizine (ZYRTEC) 10 MG tablet  Daily       ? 08/13/21 1723  ?  fluticasone (FLONASE) 50 MCG/ACT nasal spray  Daily       ? 08/13/21 1723  ? ?  ?  ? ?  ? ? ?  ?Virgina Norfolk, DO ?08/13/21 1725 ? ?

## 2021-08-13 NOTE — ED Notes (Signed)
Pt reports having facial pain and sinus drainage for several days, states she gets "sinusitis " every year. ?

## 2022-03-04 ENCOUNTER — Encounter (HOSPITAL_BASED_OUTPATIENT_CLINIC_OR_DEPARTMENT_OTHER): Payer: Self-pay | Admitting: *Deleted

## 2022-03-04 ENCOUNTER — Emergency Department (HOSPITAL_COMMUNITY)
Admission: EM | Admit: 2022-03-04 | Discharge: 2022-03-04 | Disposition: A | Discharge status: Home / Self Care | Payer: Self-pay | Attending: Emergency Medicine | Admitting: Emergency Medicine

## 2022-03-04 ENCOUNTER — Encounter (HOSPITAL_COMMUNITY): Payer: Self-pay

## 2022-03-04 DIAGNOSIS — K029 Dental caries, unspecified: Secondary | ICD-10-CM

## 2022-03-04 DIAGNOSIS — K0889 Other specified disorders of teeth and supporting structures: Secondary | ICD-10-CM | POA: Insufficient documentation

## 2022-03-04 MED ORDER — CLINDAMYCIN HCL 300 MG PO CAPS
300.0000 mg | ORAL_CAPSULE | Freq: Once | ORAL | Status: AC
  Administered 2022-03-04: 300 mg via ORAL
  Filled 2022-03-04: qty 1

## 2022-03-04 MED ORDER — CLINDAMYCIN HCL 300 MG PO CAPS: 300.0000 mg | ORAL_CAPSULE | Freq: Four times a day (QID) | ORAL | 0 refills | Status: AC

## 2022-03-04 MED ORDER — HYDROCODONE-ACETAMINOPHEN 5-325 MG PO TABS: 1.0000 | ORAL_TABLET | Freq: Four times a day (QID) | ORAL | 0 refills | Status: AC | PRN

## 2022-03-04 NOTE — ED Triage Notes (Signed)
Patient arrived with complaints of a dental abscess on the left side of her mouth, reports pain started last night.

## 2022-03-04 NOTE — ED Provider Notes (Signed)
  [facility] DEPT Provider Note   CSN: [id number] Arrival date & time: 03/04/22  0541     History  Chief Complaint  Patient presents with   Dental Pain    Elaine Baker is a 56 y.o. female.  She will patient is a 56 year old female with no significant past medical history.  Patient presenting with complaints of dental pain.  She has multiple decayed teeth that are now painful with swelling of the gums.  She denies any difficulty breathing or swallowing.  The history is provided by the patient.       Home Medications Prior to Admission medications   Not on File      Allergies    Patient has no known allergies.    Review of Systems   Review of Systems  All other systems reviewed and are negative.   Physical Exam Updated Vital Signs BP 103/81   Pulse 72   Temp 97.7 F (36.5 C) (Oral)   Resp 16   SpO2 99%  Physical Exam Vitals and nursing note reviewed.  Constitutional:      General: She is not in acute distress.    Appearance: Normal appearance. She is not ill-appearing.  HENT:     Head: Normocephalic and atraumatic.     Mouth/Throat:     Comments: There are multiple heavily decayed and missing teeth throughout.  The upper and lower molars on the left have surrounding gingival inflammation, but no obvious abscess. Pulmonary:     Effort: Pulmonary effort is normal.  Skin:    General: Skin is warm and dry.  Neurological:     Mental Status: She is alert and oriented to person, place, and time.     ED Results / Procedures / Treatments   Labs (all labs ordered are listed, but only abnormal results are displayed) Labs Reviewed - No data to display  EKG None  Radiology No results found.  Procedures Procedures    Medications Ordered in ED Medications - No data to display  ED Course/ Medical Decision Making/ A&P  Patient presenting with dental pain as described in the HPI.  She will be treated with an antibiotic and  pain medication.  To follow-up with dentistry and return as needed.  Final Clinical Impression(s) / ED Diagnoses Final diagnoses:  None    Rx / DC Orders ED Discharge Orders     None         Veryl Speak, MD 03/04/22 [phone]

## 2022-03-04 NOTE — Discharge Instructions (Signed)
Begin taking clindamycin as prescribed.  Take ibuprofen 600 mg every 6 hours as needed for pain.  Begin taking hydrocodone as prescribed as needed for pain not relieved with ibuprofen.  Follow-up with dentistry.

## 2022-03-06 ENCOUNTER — Ambulatory Visit: Payer: Self-pay | Admitting: *Deleted

## 2022-03-06 NOTE — Telephone Encounter (Signed)
  Chief Complaint: SOB Symptoms: SOB at rest, "Some" wheezing, fatigued, "Bad taste in mouth." Frequency: Yesterday Pertinent Negatives: Patient denies fever Disposition: [] ED /[x] Urgent Care (no appt availability in office) / [] Appointment(In office/virtual)/ []  [facility]/ [] Home Care/ [] Refused Recommended Disposition /[] [facility]/ []  Follow-up with PCP Additional Notes: Advised UC, states will follow disposition, care advise provided. No PCP Reason for Disposition  [1] MILD difficulty breathing (e.g., minimal/no SOB at rest, SOB with walking, pulse <100) AND [2] NEW-onset or WORSE than normal  Answer Assessment - Initial Assessment Questions 1. RESPIRATORY STATUS: "Describe your breathing?" (e.g., wheezing, shortness of breath, unable to speak, severe coughing)      SOB 2. ONSET: "When did this breathing problem begin?"      LAst night 3. PATTERN "Does the difficult breathing come and go, or has it been constant since it started?"      Comes and goes 4. SEVERITY: "How bad is your breathing?" (e.g., mild, moderate, severe)    - MILD: No SOB at rest, mild SOB with walking, speaks normally in sentences, can lie down, no retractions, pulse < 100.    - MODERATE: SOB at rest, SOB with minimal exertion and prefers to sit, cannot lie down flat, speaks in phrases, mild retractions, audible wheezing, pulse 100-120.    - SEVERE: Very SOB at rest, speaks in single words, struggling to breathe, sitting hunched forward, retractions, pulse > 120      Moderate, little wheezing 5. RECURRENT SYMPTOM: "Have you had difficulty breathing before?" If Yes, ask: "When was the last time?" and "What happened that time?"      Long time ago 6. CARDIAC HISTORY: "Do you have any history of heart disease?" (e.g., heart attack, angina, bypass surgery, angioplasty)       7. LUNG HISTORY: "Do you have any history of lung disease?"  (e.g., pulmonary embolus, asthma, emphysema)      8. CAUSE:  "What do you think is causing the breathing problem?"       9. OTHER SYMPTOMS: "Do you have any other symptoms? (e.g., dizziness, runny nose, cough, chest pain, fever)     Fatigued 10. O2 SATURATION MONITOR:  "Do you use an oxygen saturation monitor (pulse oximeter) at home?" If Yes, ask: "What is your reading (oxygen level) today?" "What is your usual oxygen saturation reading?" (e.g., 95%)       NA  Protocols used: Breathing Difficulty-A-AH

## 2022-04-28 ENCOUNTER — Emergency Department (HOSPITAL_BASED_OUTPATIENT_CLINIC_OR_DEPARTMENT_OTHER)
Admission: EM | Admit: 2022-04-28 | Discharge: 2022-04-28 | Discharge status: Against Medical Advice | Payer: Self-pay | Attending: Emergency Medicine | Admitting: Emergency Medicine

## 2022-04-28 ENCOUNTER — Other Ambulatory Visit: Payer: Self-pay

## 2022-04-28 ENCOUNTER — Encounter (HOSPITAL_BASED_OUTPATIENT_CLINIC_OR_DEPARTMENT_OTHER): Payer: Self-pay | Admitting: Emergency Medicine

## 2022-04-28 ENCOUNTER — Emergency Department (HOSPITAL_BASED_OUTPATIENT_CLINIC_OR_DEPARTMENT_OTHER): Payer: Self-pay

## 2022-04-28 DIAGNOSIS — Z5321 Procedure and treatment not carried out due to patient leaving prior to being seen by health care provider: Secondary | ICD-10-CM | POA: Insufficient documentation

## 2022-04-28 DIAGNOSIS — R059 Cough, unspecified: Secondary | ICD-10-CM | POA: Insufficient documentation

## 2022-04-28 DIAGNOSIS — R0981 Nasal congestion: Secondary | ICD-10-CM | POA: Insufficient documentation

## 2022-04-28 NOTE — ED Triage Notes (Signed)
Pt arrives pov, steady gait, reports recent dx with URI, reports coughing and congestion with nasal drainage. Reports abx complete. Denies fever. Requests CXR declines resp swab.

## 2023-04-05 ENCOUNTER — Emergency Department (HOSPITAL_BASED_OUTPATIENT_CLINIC_OR_DEPARTMENT_OTHER)
Admission: EM | Admit: 2023-04-05 | Discharge: 2023-04-05 | Discharge status: Against Medical Advice | Payer: Self-pay | Attending: Emergency Medicine | Admitting: Emergency Medicine

## 2023-04-05 ENCOUNTER — Encounter (HOSPITAL_BASED_OUTPATIENT_CLINIC_OR_DEPARTMENT_OTHER): Payer: Self-pay | Admitting: Emergency Medicine

## 2023-04-05 ENCOUNTER — Emergency Department (HOSPITAL_BASED_OUTPATIENT_CLINIC_OR_DEPARTMENT_OTHER): Payer: Self-pay

## 2023-04-05 DIAGNOSIS — K219 Gastro-esophageal reflux disease without esophagitis: Secondary | ICD-10-CM | POA: Insufficient documentation

## 2023-04-05 DIAGNOSIS — Z5321 Procedure and treatment not carried out due to patient leaving prior to being seen by health care provider: Secondary | ICD-10-CM | POA: Insufficient documentation

## 2023-04-05 DIAGNOSIS — M549 Dorsalgia, unspecified: Secondary | ICD-10-CM | POA: Insufficient documentation

## 2023-04-05 LAB — CBC WITH DIFFERENTIAL/PLATELET
Abs Immature Granulocytes: 0.01 10*3/uL (ref 0.00–0.07)
Basophils Absolute: 0 10*3/uL (ref 0.0–0.1)
Basophils Relative: 1 %
Eosinophils Absolute: 0.3 10*3/uL (ref 0.0–0.5)
Eosinophils Relative: 4 %
HCT: 37.5 % (ref 36.0–46.0)
Hemoglobin: 12.1 g/dL (ref 12.0–15.0)
Immature Granulocytes: 0 %
Lymphocytes Relative: 39 %
Lymphs Abs: 2.4 10*3/uL (ref 0.7–4.0)
MCH: 28.1 pg (ref 26.0–34.0)
MCHC: 32.3 g/dL (ref 30.0–36.0)
MCV: 87 fL (ref 80.0–100.0)
Monocytes Absolute: 0.4 10*3/uL (ref 0.1–1.0)
Monocytes Relative: 7 %
Neutro Abs: 2.9 10*3/uL (ref 1.7–7.7)
Neutrophils Relative %: 49 %
Platelets: 274 10*3/uL (ref 150–400)
RBC: 4.31 MIL/uL (ref 3.87–5.11)
RDW: 13.3 % (ref 11.5–15.5)
WBC: 6 10*3/uL (ref 4.0–10.5)
nRBC: 0 % (ref 0.0–0.2)

## 2023-04-05 LAB — TROPONIN I (HIGH SENSITIVITY): Troponin I (High Sensitivity): 3 ng/L (ref <18)

## 2023-04-05 NOTE — ED Notes (Signed)
Called in [place], 3rd call, no answer 

## 2023-04-05 NOTE — ED Notes (Signed)
Called in [place], 2nd call, no answer

## 2023-04-05 NOTE — ED Triage Notes (Signed)
Pt c/o upper middle CP x 3 wks; sts she has acid reflux and her iron is usually low; reports BMs are intermittent and she has back pain

## 2023-04-05 NOTE — ED Notes (Signed)
Called in [place] x 1, no answer 

## 2023-11-05 ENCOUNTER — Encounter (HOSPITAL_BASED_OUTPATIENT_CLINIC_OR_DEPARTMENT_OTHER): Payer: Self-pay

## 2023-11-05 ENCOUNTER — Other Ambulatory Visit: Payer: Self-pay

## 2023-11-05 ENCOUNTER — Emergency Department (HOSPITAL_BASED_OUTPATIENT_CLINIC_OR_DEPARTMENT_OTHER)
Admission: EM | Admit: 2023-11-05 | Discharge: 2023-11-05 | Disposition: A | Discharge status: Home / Self Care | Attending: Emergency Medicine | Admitting: Emergency Medicine

## 2023-11-05 ENCOUNTER — Other Ambulatory Visit (HOSPITAL_BASED_OUTPATIENT_CLINIC_OR_DEPARTMENT_OTHER): Payer: Self-pay

## 2023-11-05 DIAGNOSIS — I10 Essential (primary) hypertension: Secondary | ICD-10-CM | POA: Diagnosis not present

## 2023-11-05 DIAGNOSIS — Z79899 Other long term (current) drug therapy: Secondary | ICD-10-CM | POA: Diagnosis not present

## 2023-11-05 DIAGNOSIS — J0110 Acute frontal sinusitis, unspecified: Secondary | ICD-10-CM | POA: Diagnosis not present

## 2023-11-05 LAB — RESP PANEL BY RT-PCR (RSV, FLU A&B, COVID)  RVPGX2
Influenza A by PCR: NEGATIVE
Influenza B by PCR: NEGATIVE
Resp Syncytial Virus by PCR: NEGATIVE
SARS Coronavirus 2 by RT PCR: NEGATIVE

## 2023-11-05 MED ORDER — NAPROXEN 500 MG PO TABS
500.0000 mg | ORAL_TABLET | Freq: Two times a day (BID) | ORAL | 0 refills | Status: AC
  Filled 2023-11-05: qty 30, 15d supply, fill #0

## 2023-11-05 MED ORDER — AMLODIPINE BESYLATE 5 MG PO TABS
5.0000 mg | ORAL_TABLET | Freq: Every day | ORAL | 1 refills | Status: AC
  Filled 2023-11-05: qty 30, 30d supply, fill #0

## 2023-11-05 MED ORDER — ALBUTEROL SULFATE HFA 108 (90 BASE) MCG/ACT IN AERS
2.0000 | INHALATION_SPRAY | Freq: Once | RESPIRATORY_TRACT | Status: AC
  Administered 2023-11-05: 2 via RESPIRATORY_TRACT
  Filled 2023-11-05: qty 6.7

## 2023-11-05 MED ORDER — AMOXICILLIN-POT CLAVULANATE 875-125 MG PO TABS
1.0000 | ORAL_TABLET | Freq: Two times a day (BID) | ORAL | 0 refills | Status: AC
  Filled 2023-11-05: qty 14, 7d supply, fill #0

## 2023-11-05 NOTE — ED Triage Notes (Signed)
 Complaining of a cough and congestion for a couple of days. Said that her mucus is clear but it is causing her throat to hurt.

## 2023-11-05 NOTE — ED Provider Notes (Signed)
 [place] [facility] Provider Note   CSN: [id number] Arrival date & time: 11/05/23  1536     History  Chief Complaint  Patient presents with   Cough    Elaine Baker is a 58 y.o. female.  HPI       For one month congestion, cough. Had COVID/flu/RSV test at [facility] 5/20 which were negative Cough improving Was sneezing Now just having drainage from nose to back of throat.  Bought some flonase  but made it burn.  Taking vitamins, some constipation, last BM was today but hard.   Does not have doctor, does not have medications for blood pressures. Used to be on medication but not anymore. No chest pain or dyspnea. No nausea or vomiting.  Denies numbness, weakness, difficulty talking or walking, visual changes or facial droop.   Right knee pain with cloudy weather  Out of inhaler, history of asthma Smoking cigarettes   Past Medical History:  Diagnosis Date   Depression with anxiety    GERD (gastroesophageal reflux disease)    Hypertension    Migraine    Sinus congestion      Home Medications Prior to Admission medications   Medication Sig Start Date End Date Taking? Authorizing Provider  amoxicillin -clavulanate (AUGMENTIN ) 875-125 MG tablet Take 1 tablet by mouth every 12 (twelve) hours. 11/05/23  Yes Scarlette Currier, MD  naproxen  (NAPROSYN ) 500 MG tablet Take 1 tablet (500 mg total) by mouth 2 (two) times daily with a meal. 11/05/23  Yes Scarlette Currier, MD  acetaminophen  (TYLENOL ) 500 MG tablet Take 1,500 mg by mouth 2 (two) times daily as needed (pain).    [provider]  amLODipine  (NORVASC ) 5 MG tablet Take 1 tablet (5 mg total) by mouth daily. 11/05/23 12/05/23  Scarlette Currier, MD  cetirizine  (ZYRTEC ) 10 MG tablet Take 1 tablet (10 mg total) by mouth daily. 08/13/21   Curatolo, Adam, DO  clindamycin  (CLEOCIN ) 300 MG capsule Take 1 capsule (300 mg total) by mouth 4 (four) times daily. X 7 days 03/04/22   Orvilla Blander, MD   cyclobenzaprine  (FLEXERIL ) 10 MG tablet Take 1 tablet (10 mg total) by mouth 2 (two) times daily as needed for muscle spasms. 08/14/20   Darlis Eisenmenger, PA-C  diphenhydrAMINE -Phenylephrine (THERAFLU COLD/COUGH NIGHTTIME PO) Take 1 packet by mouth at bedtime.    [provider]  famotidine  (PEPCID ) 20 MG tablet Take 1 tablet (20 mg total) by mouth 2 (two) times daily. 12/21/19   Couture, Cortni S, PA-C  fluticasone  (FLONASE ) 50 MCG/ACT nasal spray Place 2 sprays into both nostrils daily. 08/13/21   Curatolo, Adam, DO  HYDROcodone -acetaminophen  (NORCO) 5-325 MG tablet Take 1-2 tablets by mouth every 6 (six) hours as needed. 03/04/22   Orvilla Blander, MD  Multiple Vitamin (MULTIVITAMIN WITH MINERALS) TABS tablet Take 1 tablet by mouth daily.    [provider]  olopatadine  (PATADAY ) 0.1 % ophthalmic solution Place 1 drop into both eyes 2 (two) times daily. 09/16/20   Almond Army, MD  polyethylene glycol (MIRALAX ) 17 g packet Take 17 g by mouth daily. Dissolve one cap full in solution (water, gatorade, etc.) and administer once cap-full daily. You may titrate up daily by 1 cap-full until the patient is having pudding consistency of stools. After the patient is able to start passing softer stools they will need to be on 1/2 cap-full daily for 2 weeks. 12/21/19   Couture, Cortni S, PA-C  Tetrahydrozoline HCl (VISINE OP) Place 1 drop into  both eyes daily.    [provider]      Allergies    Patient has no known allergies.    Review of Systems   Review of Systems  Physical Exam Updated Vital Signs BP (!) 153/109 (BP Location: Left Arm)   Pulse 76   Temp 98.8 F (37.1 C) (Oral)   Resp 18   Ht 5' 4 (1.626 m)   Wt 64.9 kg   SpO2 100%   BMI 24.55 kg/m  Physical Exam Vitals and nursing note reviewed.  Constitutional:      General: She is not in acute distress.    Appearance: Normal appearance. She is well-developed. She is not ill-appearing or diaphoretic.  HENT:      Head: Normocephalic and atraumatic.   Eyes:     General: No visual field deficit.    Extraocular Movements: Extraocular movements intact.     Conjunctiva/sclera: Conjunctivae normal.     Pupils: Pupils are equal, round, and reactive to light.    Cardiovascular:     Rate and Rhythm: Normal rate and regular rhythm.     Pulses: Normal pulses.     Heart sounds: Normal heart sounds. No murmur heard.    No friction rub. No gallop.  Pulmonary:     Effort: Pulmonary effort is normal. No respiratory distress.     Breath sounds: Normal breath sounds. No wheezing or rales.  Abdominal:     General: There is no distension.     Palpations: Abdomen is soft.     Tenderness: There is no abdominal tenderness. There is no guarding.   Musculoskeletal:        General: No swelling or tenderness.     Cervical back: Normal range of motion.   Skin:    General: Skin is warm and dry.     Findings: No erythema or rash.   Neurological:     General: No focal deficit present.     Mental Status: She is alert and oriented to person, place, and time.     GCS: GCS eye subscore is 4. GCS verbal subscore is 5. GCS motor subscore is 6.     Cranial Nerves: No cranial nerve deficit, dysarthria or facial asymmetry.     Sensory: No sensory deficit.     Motor: No weakness or tremor.     Coordination: Coordination normal. Finger-Nose-Finger Test normal.     Gait: Gait normal.     ED Results / Procedures / Treatments   Labs (all labs ordered are listed, but only abnormal results are displayed) Labs Reviewed  RESP PANEL BY RT-PCR (RSV, FLU A&B, COVID)  [id number]    EKG None  Radiology No results found.  Procedures Procedures    Medications Ordered in ED Medications  albuterol  (VENTOLIN  HFA) 108 (90 Base) MCG/ACT inhaler 2 puff (2 puffs Inhalation Given 11/05/23 1613)    ED Course/ Medical Decision Making/ A&P                                  57yo female with history of htn, migraines,  depression who presents with concern for elevated blood pressures, also notes continued congestion.   COVID/flu/RSV testing negative.  Labs reviewed from recent [facility] visit 5/20 without signiicant renal disease.  Has mildly elevated BP without signs of hypertensive emergency.  For BP--will initiate amlodipine  and recommend PCP follow up.   Low clinical suspicion for pneumonia in  setting of afebrile, no dyspnea, no hypoxia, improving cough.  Given continued congestion will treat for bacterial sinusitis with augmentin .  Patient discharged in stable condition with understanding of reasons to return.        Final Clinical Impression(s) / ED Diagnoses Final diagnoses:  Hypertension, unspecified type  Acute frontal sinusitis, recurrence not specified    Rx / DC Orders ED Discharge Orders          Ordered    amLODipine  (NORVASC ) 5 MG tablet  Daily        11/05/23 1605    amoxicillin -clavulanate (AUGMENTIN ) 875-125 MG tablet  Every 12 hours        11/05/23 1606    naproxen  (NAPROSYN ) 500 MG tablet  2 times daily with meals        11/05/23 1607              Scarlette Currier, MD 11/07/23 2335
